# Patient Record
Sex: Female | Born: 1955 | Race: White | Hispanic: No | Marital: Married | Smoking: Former smoker
Health system: Southern US, Community
[De-identification: ages and names within clinical notes are randomized; demographics above are authoritative.]

## PROBLEM LIST (undated history)

## (undated) DIAGNOSIS — Z9889 Other specified postprocedural states: Secondary | ICD-10-CM

## (undated) DIAGNOSIS — R112 Nausea with vomiting, unspecified: Secondary | ICD-10-CM

## (undated) DIAGNOSIS — I10 Essential (primary) hypertension: Secondary | ICD-10-CM

## (undated) DIAGNOSIS — Z6824 Body mass index (BMI) 24.0-24.9, adult: Secondary | ICD-10-CM

## (undated) DIAGNOSIS — Z90710 Acquired absence of both cervix and uterus: Secondary | ICD-10-CM

## (undated) DIAGNOSIS — D069 Carcinoma in situ of cervix, unspecified: Secondary | ICD-10-CM

## (undated) DIAGNOSIS — B019 Varicella without complication: Secondary | ICD-10-CM

## (undated) DIAGNOSIS — H9313 Tinnitus, bilateral: Secondary | ICD-10-CM

## (undated) DIAGNOSIS — E559 Vitamin D deficiency, unspecified: Secondary | ICD-10-CM

## (undated) DIAGNOSIS — M1990 Unspecified osteoarthritis, unspecified site: Secondary | ICD-10-CM

## (undated) DIAGNOSIS — U071 COVID-19: Secondary | ICD-10-CM

## (undated) DIAGNOSIS — D260 Other benign neoplasm of cervix uteri: Secondary | ICD-10-CM

## (undated) DIAGNOSIS — M8580 Other specified disorders of bone density and structure, unspecified site: Secondary | ICD-10-CM

## (undated) DIAGNOSIS — R000 Tachycardia, unspecified: Secondary | ICD-10-CM

## (undated) HISTORY — DX: Vitamin D deficiency, unspecified: E55.9

## (undated) HISTORY — DX: Tachycardia, unspecified: R00.0

## (undated) HISTORY — DX: Unspecified osteoarthritis, unspecified site: M19.90

## (undated) HISTORY — DX: Other specified disorders of bone density and structure, unspecified site: M85.80

## (undated) HISTORY — DX: Varicella without complication: B01.9

## (undated) HISTORY — DX: Other benign neoplasm of cervix uteri: D26.0

## (undated) HISTORY — PX: TOTAL HIP ARTHROPLASTY: SHX124

## (undated) HISTORY — DX: Tinnitus, bilateral: H93.13

## (undated) HISTORY — DX: COVID-19: U07.1

## (undated) HISTORY — DX: Essential (primary) hypertension: I10

## (undated) HISTORY — DX: Body mass index (BMI) 24.0-24.9, adult: Z68.24

## (undated) HISTORY — DX: Acquired absence of both cervix and uterus: Z90.710

## (undated) HISTORY — DX: Carcinoma in situ of cervix, unspecified: D06.9

## (undated) HISTORY — PX: COLONOSCOPY: SHX174

---

## 1981-07-08 DIAGNOSIS — D069 Carcinoma in situ of cervix, unspecified: Secondary | ICD-10-CM

## 1981-07-08 HISTORY — DX: Carcinoma in situ of cervix, unspecified: D06.9

## 1981-07-08 HISTORY — PX: CERVICAL CONE BIOPSY: SUR198

## 1987-07-09 HISTORY — PX: VAGINAL HYSTERECTOMY: SUR661

## 2007-04-09 ENCOUNTER — Encounter: Admission: RE | Admit: 2007-04-09 | Discharge: 2007-04-09 | Payer: Self-pay | Admitting: Gynecology

## 2007-04-13 ENCOUNTER — Encounter: Admission: RE | Admit: 2007-04-13 | Discharge: 2007-04-13 | Payer: Self-pay | Admitting: Gynecology

## 2009-05-10 ENCOUNTER — Encounter: Admission: RE | Admit: 2009-05-10 | Discharge: 2009-05-10 | Payer: Self-pay | Admitting: Gynecology

## 2011-01-10 ENCOUNTER — Other Ambulatory Visit: Payer: Self-pay | Admitting: Gynecology

## 2011-01-10 DIAGNOSIS — Z1231 Encounter for screening mammogram for malignant neoplasm of breast: Secondary | ICD-10-CM

## 2011-01-24 ENCOUNTER — Ambulatory Visit
Admission: RE | Admit: 2011-01-24 | Discharge: 2011-01-24 | Disposition: A | Discharge status: Home / Self Care | Payer: BC Managed Care – PPO | Source: Ambulatory Visit | Attending: Gynecology | Admitting: Gynecology

## 2011-01-24 DIAGNOSIS — Z1231 Encounter for screening mammogram for malignant neoplasm of breast: Secondary | ICD-10-CM

## 2012-11-13 ENCOUNTER — Encounter: Payer: Self-pay | Admitting: *Deleted

## 2012-11-13 DIAGNOSIS — I10 Essential (primary) hypertension: Secondary | ICD-10-CM

## 2012-11-16 ENCOUNTER — Encounter: Payer: Self-pay | Admitting: *Deleted

## 2012-11-16 ENCOUNTER — Ambulatory Visit (INDEPENDENT_AMBULATORY_CARE_PROVIDER_SITE_OTHER): Payer: BC Managed Care – PPO | Admitting: Cardiovascular Disease

## 2012-11-16 ENCOUNTER — Encounter: Payer: Self-pay | Admitting: Cardiovascular Disease

## 2012-11-16 VITALS — BP 157/86 | HR 101 | Ht 64.0 in | Wt 127.8 lb

## 2012-11-16 DIAGNOSIS — I10 Essential (primary) hypertension: Secondary | ICD-10-CM

## 2012-11-16 DIAGNOSIS — R002 Palpitations: Secondary | ICD-10-CM | POA: Insufficient documentation

## 2012-11-16 DIAGNOSIS — F411 Generalized anxiety disorder: Secondary | ICD-10-CM

## 2012-11-16 DIAGNOSIS — F419 Anxiety disorder, unspecified: Secondary | ICD-10-CM | POA: Insufficient documentation

## 2012-11-16 NOTE — Progress Notes (Signed)
Patient ID: Nancy Burton, female   DOB: [birth year], 57 y.o.   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 Tax season just ended. Her daughter is in [place] and just got engaged.  Son moving into house to pursue MBA at [employer].  She has always been tense and anxious.  Palpitations seem benign. She does aerobil exercise every morning with no syncope, chest pain or dyspnea. She feels flip flops sometimes real hard " and I think I'm going to die".  Symptoms worse last few weeks for no apparent reason but has had them in past. No recent Hct and TSH on file.    ROS: Denies fever, malais, weight loss, blurry vision, decreased visual acuity, cough, sputum, SOB, hemoptysis, pleuritic pain, palpitaitons, heartburn, abdominal pain, melena, lower extremity edema, claudication, or rash.  All other systems reviewed and negative   General: Affect appropriate Healthy:  appears stated age HEENT: normal Neck supple with no adenopathy JVP normal no bruits no thyromegaly Lungs clear with no wheezing and good diaphragmatic motion Heart:  S1/S2 no murmur,rub, gallop or click PMI normal Abdomen: benighn, BS positve, no tenderness, no AAA no bruit.  No HSM or HJR Distal pulses intact with no bruits No edema Neuro non-focal Skin warm and dry No muscular weakness  Medications Current Outpatient Prescriptions  Medication Sig Dispense Refill  . aspirin 81 MG tablet Take 81 mg by mouth daily.      . Cholecalciferol (VITAMIN D-3) 1000 UNITS CAPS Take 1 capsule by mouth daily.      . fish oil-omega-3 fatty acids 1000 MG capsule Take 2 g by mouth daily.      Marland Kitchen lisinopril (PRINIVIL,ZESTRIL) 10 MG tablet       . Multiple Vitamin (MULTIVITAMIN) capsule Take 1 capsule by mouth daily.      Marland Kitchen PREVIDENT 5000 BOOSTER PLUS 1.1 % PSTE        No current facility-administered medications for this visit.     Allergies Review of patient's allergies indicates no known allergies.  Family History: Family History  Problem Relation Age of Onset  . Hyperlipidemia Father     Social History: History   Social History  . Marital Status: Married    Spouse Name: N/A    Number of Children: N/A  . Years of Education: N/A   Occupational History  . Not on file.   Social History Main Topics  . Smoking status: Former Games developer  . Smokeless tobacco: Not on file     Comment: quit 30 years ago  . Alcohol Use: No  . Drug Use: No  . Sexually Active: Not on file   Other Topics Concern  . Not on file   Social History Narrative  . No narrative on file    Electrocardiogram:  SR rate 95 normal ECG   Assessment and Plan

## 2012-11-16 NOTE — Assessment & Plan Note (Signed)
Benign sounding F/U stress echo to r/o structural heart disease

## 2012-11-16 NOTE — Assessment & Plan Note (Signed)
White coart overlay BP readings at home 90-100 systolic.  Continue low dose ACE

## 2012-11-16 NOTE — Addendum Note (Signed)
Addended by: Sherri Rad C on: 11/16/2012 02:39 PM   Modules accepted: Orders

## 2012-11-16 NOTE — Assessment & Plan Note (Signed)
Should consider cymbalta or other Rx.  She will likely feel better when daughter moves back from [place]

## 2012-11-16 NOTE — Patient Instructions (Addendum)
Your physician has requested that you have a stress echocardiogram. For further information please visit [email or web address] Please follow instruction sheet as given.  Your physician recommends that you have lab work today: hemoglobin/hematocrit/tsh/t4   Dr. Eden Emms will see you back on an as needed basis if your test are all normal.

## 2012-11-17 ENCOUNTER — Encounter: Payer: Self-pay | Admitting: *Deleted

## 2012-11-17 LAB — HEMATOCRIT: HCT: 37.2 % (ref 36.0–46.0)

## 2012-11-17 LAB — HEMOGLOBIN: Hemoglobin: 12.9 g/dL (ref 12.0–15.0)

## 2012-11-24 ENCOUNTER — Encounter (HOSPITAL_COMMUNITY): Payer: Self-pay

## 2012-11-24 ENCOUNTER — Ambulatory Visit (HOSPITAL_COMMUNITY)
Admission: RE | Admit: 2012-11-24 | Discharge: 2012-11-24 | Disposition: A | Discharge status: Home / Self Care | Payer: BC Managed Care – PPO | Source: Ambulatory Visit | Attending: Cardiovascular Disease | Admitting: Cardiovascular Disease

## 2012-11-24 DIAGNOSIS — I10 Essential (primary) hypertension: Secondary | ICD-10-CM | POA: Insufficient documentation

## 2012-11-24 DIAGNOSIS — F419 Anxiety disorder, unspecified: Secondary | ICD-10-CM

## 2012-11-24 DIAGNOSIS — R002 Palpitations: Secondary | ICD-10-CM

## 2012-11-24 NOTE — Progress Notes (Signed)
*  PRELIMINARY RESULTS* Echocardiogram Echocardiogram Stress Test has been performed.  Conrad Mineral 11/24/2012, 10:27 AM

## 2012-11-24 NOTE — Progress Notes (Signed)
Stress Lab Nurses Notes - [facility]  Sarahi Borland 11/24/2012 Reason for doing test: Palpitation & Anxiety Type of test: Stress Echo Nurse performing test: Parke Poisson, RN Nuclear Medicine Tech: Not Applicable Echo Tech: Karrie Doffing MD performing test: R. Rothbart Family MD: [facility] Test explained and consent signed: yes IV started: No IV started Symptoms: Fatigue & Mild SOB Treatment/Intervention: None Reason test stopped: reached target HR After recovery IV was: NA Patient to return to Nuc. Med at : NA Patient discharged: Home Patient's Condition upon discharge was: stable Comments: During test peak BP 182/78 & HR 184.   Recovery BP 141/74 & HR 117 .   Symptoms resolved in recovery. Erskine Speed T

## 2013-01-04 ENCOUNTER — Other Ambulatory Visit: Payer: Self-pay

## 2013-04-13 ENCOUNTER — Other Ambulatory Visit: Payer: Self-pay

## 2013-04-13 DIAGNOSIS — Z1231 Encounter for screening mammogram for malignant neoplasm of breast: Secondary | ICD-10-CM

## 2013-05-19 ENCOUNTER — Ambulatory Visit
Admission: RE | Admit: 2013-05-19 | Discharge: 2013-05-19 | Disposition: A | Discharge status: Home / Self Care | Payer: BC Managed Care – PPO | Source: Ambulatory Visit

## 2013-05-19 ENCOUNTER — Other Ambulatory Visit (HOSPITAL_COMMUNITY)
Admission: RE | Admit: 2013-05-19 | Discharge: 2013-05-19 | Disposition: A | Discharge status: Home / Self Care | Payer: BC Managed Care – PPO | Source: Ambulatory Visit | Attending: Gynecology | Admitting: Gynecology

## 2013-05-19 ENCOUNTER — Encounter: Payer: Self-pay | Admitting: Gynecology

## 2013-05-19 ENCOUNTER — Ambulatory Visit (INDEPENDENT_AMBULATORY_CARE_PROVIDER_SITE_OTHER): Payer: BC Managed Care – PPO | Admitting: Gynecology

## 2013-05-19 VITALS — BP 122/78 | Ht 64.0 in | Wt 135.0 lb

## 2013-05-19 DIAGNOSIS — N952 Postmenopausal atrophic vaginitis: Secondary | ICD-10-CM

## 2013-05-19 DIAGNOSIS — Z01419 Encounter for gynecological examination (general) (routine) without abnormal findings: Secondary | ICD-10-CM | POA: Insufficient documentation

## 2013-05-19 DIAGNOSIS — Z1231 Encounter for screening mammogram for malignant neoplasm of breast: Secondary | ICD-10-CM

## 2013-05-19 DIAGNOSIS — IMO0002 Reserved for concepts with insufficient information to code with codable children: Secondary | ICD-10-CM

## 2013-05-19 DIAGNOSIS — M8580 Other specified disorders of bone density and structure, unspecified site: Secondary | ICD-10-CM

## 2013-05-19 DIAGNOSIS — M899 Disorder of bone, unspecified: Secondary | ICD-10-CM

## 2013-05-19 NOTE — Patient Instructions (Signed)
Followup for bone density as scheduled. Otherwise followup in one year for annual exam. 

## 2013-05-19 NOTE — Progress Notes (Addendum)
Nancy Burton April 22, 1956 [id number]        57 y.o.  [id number] for annual exam.  Former patient of Dr. Nicholas Lose doing well without complaints.  Past medical history,surgical history, problem list, medications, allergies, family history and social history were all reviewed and documented in the EPIC chart.  ROS:  Performed and pertinent positives and negatives are included in the history, assessment and plan .  Exam: Kim assistant Filed Vitals:   05/19/13 1454  BP: 122/78  Height: 5\' 4"  (1.626 m)  Weight: 135 lb (61.236 kg)   General appearance  Normal Skin grossly normal Head/Neck normal with no cervical or supraclavicular adenopathy thyroid normal Lungs  clear Cardiac RR, without RMG Abdominal  soft, nontender, without masses, organomegaly or hernia Breasts  examined lying and sitting without masses, retractions, discharge or axillary adenopathy. Pelvic  Ext/BUS/vagina  with atrophic changes, PAP of cuff done   Adnexa  Without masses or tenderness    Anus and perineum  normal   Rectovaginal  normal sphincter tone without palpated masses or tenderness.    Assessment/Plan:  57 y.o. [id number] female for annual exam.   1. History of vaginal hysterectomy in followup of cone biopsy for persistent cervical dysplasia historically. Patient reports her Pap smears have all been normal since then. I do not have copies of these records but recommended we continue with vaginal cuff surveillance and Pap smear was done today. Will attempt to get records from the hysterectomy. 2. Atrophic vaginal changes. Patient is having some dyspareunia with intercourse. Not having vaginal dryness in between. No significant hot flushes or night sweats. Options for management were reviewed to include OTC products versus vaginal estrogen such as estrogen cream, Vagifem, Osphena and full HRT. Patient is not interested in estrogen products. She prefers OTC products and will followup if this continues to be an  issue. 3. Pap smear 2012. Pap smear done today per #1. Assuming negative will pursue less frequent screening intervals every 3 years but will continue to screen status post hysterectomy given cervical dysplasia as indication for hysterectomy. 4. Mammography scheduled today. Will continue with annual mammography. SBE monthly reviewed. 5. DEXA reported 2012. Was told that she had some weakness. Repeat DEXA now. Increase calcium vitamin D reviewed. Will obtain copy of her prior DEXA possible. 6. Colonoscopy 2013. Repeat that their recommended interval. 7. Health maintenance. No blood work done as this is reportedly done through her primary physician's office. Followup one year, sooner as needed.   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  Note: This document was prepared with digital dictation and possible smart phrase technology. Any transcriptional errors that result from this process are unintentional.   Dara Lords MD, 4:13 PM 05/19/2013

## 2013-05-20 LAB — URINALYSIS W MICROSCOPIC + REFLEX CULTURE
Casts: NONE SEEN
Crystals: NONE SEEN
Glucose, UA: NEGATIVE mg/dL
Leukocytes, UA: NEGATIVE
Nitrite: NEGATIVE
Specific Gravity, Urine: 1.006 (ref 1.005–1.030)
Squamous Epithelial / LPF: NONE SEEN

## 2013-05-26 ENCOUNTER — Encounter: Payer: Self-pay | Admitting: Gynecology

## 2013-06-21 ENCOUNTER — Ambulatory Visit (INDEPENDENT_AMBULATORY_CARE_PROVIDER_SITE_OTHER): Payer: BC Managed Care – PPO

## 2013-06-21 DIAGNOSIS — M8580 Other specified disorders of bone density and structure, unspecified site: Secondary | ICD-10-CM

## 2013-06-21 DIAGNOSIS — M899 Disorder of bone, unspecified: Secondary | ICD-10-CM

## 2013-06-22 ENCOUNTER — Encounter: Payer: Self-pay | Admitting: Gynecology

## 2014-05-09 ENCOUNTER — Encounter: Payer: Self-pay | Admitting: Gynecology

## 2014-07-16 ENCOUNTER — Ambulatory Visit: Payer: Self-pay | Admitting: Orthopedic Surgery

## 2014-07-16 NOTE — Pre-Procedure Instructions (Signed)
Nancy Burton  07/16/2014   Your procedure is scheduled on:  January 22  Report to [facility] Admitting at 07:30 AM.  Call this number if you have problems the morning of surgery: [phone]   Remember:   Do not eat food or drink liquids after midnight.   Take these medicines the morning of surgery with A SIP OF WATER: None   STOP Calcium, Fish Oil, Multiple Vitamin January 15   STOP/ Do not take Aspirin, Aleve, Naproxen, Advil, Ibuprofen, Motrin, Vitamins, Herbs, or Supplements starting January 15   Do not wear jewelry, make-up or nail polish.  Do not wear lotions, powders, or perfumes. You may wear deodorant.  Do not shave 48 hours prior to surgery. Men may shave face and neck.  Do not bring valuables to the hospital.  [facility] is not responsible for any belongings or valuables.               Contacts, dentures or bridgework may not be worn into surgery.  Leave suitcase in the car. After surgery it may be brought to your room.  For patients admitted to the hospital, discharge time is determined by your treatment team.               Special Instructions: [facility] - Preparing for Surgery  Before surgery, you can play an important role.  Because skin is not sterile, your skin needs to be as free of germs as possible.  You can reduce the number of germs on you skin by washing with CHG (chlorahexidine gluconate) soap before surgery.  CHG is an antiseptic cleaner which kills germs and bonds with the skin to continue killing germs even after washing.  Please DO NOT use if you have an allergy to CHG or antibacterial soaps.  If your skin becomes reddened/irritated stop using the CHG and inform your nurse when you arrive at Short Stay.  Do not shave (including legs and underarms) for at least 48 hours prior to the first CHG shower.  You may shave your face.  Please follow these instructions carefully:   1.  Shower with CHG Soap the night before surgery and the  morning of Surgery.  2.  If you choose to wash your hair, wash your hair first as usual with your normal shampoo.  3.  After you shampoo, rinse your hair and body thoroughly to remove the shampoo.  4.  Use CHG as you would any other liquid soap.  You can apply CHG directly to the skin and wash gently with scrungie or a clean washcloth.  5.  Apply the CHG Soap to your body ONLY FROM THE NECK DOWN.  Do not use on open wounds or open sores.  Avoid contact with your eyes, ears, mouth and genitals (private parts).  Wash genitals (private parts) with your normal soap.  6.  Wash thoroughly, paying special attention to the area where your surgery will be performed.  7.  Thoroughly rinse your body with warm water from the neck down.  8.  DO NOT shower/wash with your normal soap after using and rinsing off the CHG Soap.  9.  Pat yourself dry with a clean towel.            10.  Wear clean pajamas.            11.  Place clean sheets on your bed the night of your first shower and do not sleep with pets.  Day  of Surgery  Do not apply any lotions the morning of surgery.  Please wear clean clothes to the hospital/surgery center.     Please read over the following fact sheets that you were given: Pain Booklet, Coughing and Deep Breathing, Blood Transfusion Information and Surgical Site Infection Prevention

## 2014-07-16 NOTE — Progress Notes (Signed)
Preoperative surgical orders have been place into the Epic hospital system for Nancy Burton on 07/16/2014, 9:29 AM  by Mickel Crow for surgery on 07/29/2014.  Preop Total Hip - Anterior Approach orders including Experel Injecion, IV Tylenol, and IV Decadron as long as there are no contraindications to the above medications. [name], PA-C

## 2014-07-18 ENCOUNTER — Encounter (HOSPITAL_COMMUNITY): Payer: Self-pay

## 2014-07-18 ENCOUNTER — Encounter (HOSPITAL_COMMUNITY)
Admission: RE | Admit: 2014-07-18 | Discharge: 2014-07-18 | Disposition: A | Discharge status: Home / Self Care | Payer: BLUE CROSS/BLUE SHIELD | Source: Ambulatory Visit | Attending: Orthopedic Surgery | Admitting: Orthopedic Surgery

## 2014-07-18 DIAGNOSIS — Z01812 Encounter for preprocedural laboratory examination: Secondary | ICD-10-CM | POA: Insufficient documentation

## 2014-07-18 DIAGNOSIS — Z01810 Encounter for preprocedural cardiovascular examination: Secondary | ICD-10-CM | POA: Insufficient documentation

## 2014-07-18 DIAGNOSIS — Z01818 Encounter for other preprocedural examination: Secondary | ICD-10-CM | POA: Insufficient documentation

## 2014-07-18 DIAGNOSIS — M1612 Unilateral primary osteoarthritis, left hip: Secondary | ICD-10-CM | POA: Insufficient documentation

## 2014-07-18 HISTORY — DX: Unspecified osteoarthritis, unspecified site: M19.90

## 2014-07-18 HISTORY — DX: Nausea with vomiting, unspecified: R11.2

## 2014-07-18 HISTORY — DX: Other specified postprocedural states: Z98.890

## 2014-07-18 LAB — URINALYSIS, ROUTINE W REFLEX MICROSCOPIC
Bilirubin Urine: NEGATIVE
Glucose, UA: NEGATIVE mg/dL
HGB URINE DIPSTICK: NEGATIVE
Ketones, ur: NEGATIVE mg/dL
Leukocytes, UA: NEGATIVE
Nitrite: NEGATIVE
Protein, ur: NEGATIVE mg/dL
Specific Gravity, Urine: 1.011 (ref 1.005–1.030)
Urobilinogen, UA: 0.2 mg/dL (ref 0.0–1.0)
pH: 5 (ref 5.0–8.0)

## 2014-07-18 LAB — COMPREHENSIVE METABOLIC PANEL
ALT: 41 U/L — ABNORMAL HIGH (ref 0–35)
AST: 34 U/L (ref 0–37)
Albumin: 4.5 g/dL (ref 3.5–5.2)
Alkaline Phosphatase: 92 U/L (ref 39–117)
Anion gap: 5 (ref 5–15)
BUN: 23 mg/dL (ref 6–23)
CO2: 28 mmol/L (ref 19–32)
Calcium: 9.1 mg/dL (ref 8.4–10.5)
Chloride: 104 mEq/L (ref 96–112)
Creatinine, Ser: 0.84 mg/dL (ref 0.50–1.10)
GFR calc Af Amer: 86 mL/min — ABNORMAL LOW (ref >90)
GFR calc non Af Amer: 75 mL/min — ABNORMAL LOW (ref >90)
Glucose, Bld: 102 mg/dL — ABNORMAL HIGH (ref 70–99)
POTASSIUM: 4 mmol/L (ref 3.5–5.1)
SODIUM: 137 mmol/L (ref 135–145)
Total Bilirubin: 0.8 mg/dL (ref 0.3–1.2)
Total Protein: 7.3 g/dL (ref 6.0–8.3)

## 2014-07-18 LAB — ABO/RH: ABO/RH(D): O POS

## 2014-07-18 LAB — TYPE AND SCREEN
ABO/RH(D): O POS
ANTIBODY SCREEN: NEGATIVE

## 2014-07-18 LAB — CBC
HCT: 38.8 % (ref 36.0–46.0)
Hemoglobin: 12.7 g/dL (ref 12.0–15.0)
MCH: 30.2 pg (ref 26.0–34.0)
MCHC: 32.7 g/dL (ref 30.0–36.0)
MCV: 92.4 fL (ref 78.0–100.0)
Platelets: 337 10*3/uL (ref 150–400)
RBC: 4.2 MIL/uL (ref 3.87–5.11)
RDW: 12.6 % (ref 11.5–15.5)
WBC: 5.8 10*3/uL (ref 4.0–10.5)

## 2014-07-18 LAB — SURGICAL PCR SCREEN
MRSA, PCR: NEGATIVE
Staphylococcus aureus: NEGATIVE

## 2014-07-18 LAB — APTT: aPTT: 34 seconds (ref 24–37)

## 2014-07-18 LAB — PROTIME-INR
INR: 0.96 (ref 0.00–1.49)
PROTHROMBIN TIME: 12.9 s (ref 11.6–15.2)

## 2014-07-18 NOTE — Progress Notes (Signed)
Primary - dr. claget ([facility]) No cardiologist Stress 2014, none since

## 2014-07-26 NOTE — H&P (Signed)
TOTAL HIP ADMISSION H&P  Patient is admitted for left total hip arthroplasty.  Subjective:  Chief Complaint: left hip pain  HPI: Nancy Burton, 59 y.o. female, has a history of pain and functional disability in the left hip(s) due to arthritis and patient has failed non-surgical conservative treatments for greater than 12 weeks to include NSAID's and/or analgesics, weight reduction as appropriate and activity modification.  Onset of symptoms was gradual starting 2 years ago with gradually worsening course since that time.The patient noted no past surgery on the left hip(s).  Patient currently rates pain in the left hip at 7 out of 10 with activity. Patient has night pain, worsening of pain with activity and weight bearing, pain that interfers with activities of daily living and pain with passive range of motion. Patient has evidence of periarticular osteophytes, joint subluxation and joint space narrowing by imaging studies.  There is no current active infection.  Patient Active Problem List   Diagnosis Date Noted  . Palpitations 11/16/2012  . Anxiety 11/16/2012  . HTN (hypertension) 11/13/2012   Past Medical History  Diagnosis Date  . Hypertension   . Adenocarcinoma in situ (AIS) of uterine cervix 1983    Cone biopsy the cervix subsequent, TVH negative  . Osteopenia 06/2013    T score -1.4 FRAX 6.6%/0.4%  . PONV (postoperative nausea and vomiting)   . Arthritis   . Cancer     cervical    Past Surgical History  Procedure Laterality Date  . Vaginal hysterectomy  1989    History of  adenocarcinoma in situ of the endocervix  . Cervical cone biopsy  1983  . Colonoscopy        Current outpatient prescriptions:  .  Calcium Carbonate-Vitamin D (CALCIUM + D PO), Take 2 tablets by mouth daily. , Disp: , Rfl:  .  fish oil-omega-3 fatty acids 1000 MG capsule, Take 2 g by mouth daily., Disp: , Rfl:  .  lisinopril (PRINIVIL,ZESTRIL) 10 MG tablet, Take 10 mg by mouth daily. , Disp: ,  Rfl:  .  Multiple Vitamin (MULTIVITAMIN) capsule, Take 1 capsule by mouth daily., Disp: , Rfl:  .  acetaminophen (TYLENOL) 500 MG tablet, Take 1,000 mg by mouth every 6 (six) hours as needed., Disp: , Rfl:  .  naproxen sodium (ANAPROX) 220 MG tablet, Take 220 mg by mouth 2 (two) times daily with a meal., Disp: , Rfl:   No Known Allergies  History  Substance Use Topics  . Smoking status: Former Research scientist (life sciences)  . Smokeless tobacco: Not on file  . Alcohol Use: Yes     Comment: Occas wine    Family History  Problem Relation Age of Onset  . Hyperlipidemia Father   . Uterine cancer Mother   . Diabetes Other      Review of Systems  Constitutional: Negative.   HENT: Positive for tinnitus. Negative for congestion, ear discharge, ear pain, hearing loss, nosebleeds and sore throat.   Eyes: Negative.   Respiratory: Negative.  Negative for stridor.   Cardiovascular: Positive for palpitations. Negative for chest pain, orthopnea, claudication, leg swelling and PND.  Gastrointestinal: Negative.   Genitourinary: Negative.  Negative for dysuria, urgency, frequency, hematuria and flank pain.       Positive for incontinence  Musculoskeletal: Positive for joint pain. Negative for myalgias, back pain, falls and neck pain.       Left hip pain  Skin: Negative.   Neurological: Negative.  Negative for headaches.  Endo/Heme/Allergies: Negative.  Psychiatric/Behavioral: Negative.     Objective:  Physical Exam  Constitutional: She is oriented to person, place, and time. She appears well-developed and well-nourished. No distress.  HENT:  Head: Normocephalic and atraumatic.  Right Ear: External ear normal.  Left Ear: External ear normal.  Nose: Nose normal.  Mouth/Throat: Oropharynx is clear and moist.  Eyes: Conjunctivae and EOM are normal.  Neck: Normal range of motion. Neck supple.  Cardiovascular: Normal rate, regular rhythm, normal heart sounds and intact distal pulses.   No murmur  heard. Respiratory: Effort normal and breath sounds normal. No respiratory distress. She has no wheezes.  GI: Soft. Bowel sounds are normal. She exhibits no distension. There is no tenderness.  Musculoskeletal:  Evaluation of her left hip, flexion to 90. No internal rotation, about 10 degrees of external rotation, 20 degrees of abduction. She has a normal knee exam.  Neurological: She is alert and oriented to person, place, and time. She has normal strength and normal reflexes. No sensory deficit.  Skin: No rash noted. She is not diaphoretic. No erythema.  Psychiatric: She has a normal mood and affect. Her behavior is normal.    Vitals  Weight: 135.31 lb Height: 64in Body Surface Area: 1.66 m Body Mass Index: 23.23 kg/m  Pulse: 88 (Regular)  BP: 160/89 (Sitting, Left Arm, Standard)  Imaging Review Plain radiographs demonstrate severe degenerative joint disease of the left hip(s). The bone quality appears to be good for age and reported activity level.  Assessment/Plan:  End stage arthritis, left hip(s)  The patient history, physical examination, clinical judgement of the provider and imaging studies are consistent with end stage degenerative joint disease of the left hip(s) and total hip arthroplasty is deemed medically necessary. The treatment options including medical management, injection therapy, arthroscopy and arthroplasty were discussed at length. The risks and benefits of total hip arthroplasty were presented and reviewed. The risks due to aseptic loosening, infection, stiffness, dislocation/subluxation,  thromboembolic complications and other imponderables were discussed.  The patient acknowledged the explanation, agreed to proceed with the plan and consent was signed. Patient is being admitted for inpatient treatment for surgery, pain control, PT, OT, prophylactic antibiotics, VTE prophylaxis, progressive ambulation and ADL's and discharge planning.The patient is planning  to be discharged home with home health services    PCP: [place], [place]

## 2014-07-28 MED ORDER — SODIUM CHLORIDE 0.9 % IV SOLN: INTRAVENOUS | Status: DC

## 2014-07-28 MED ORDER — DEXAMETHASONE SODIUM PHOSPHATE 10 MG/ML IJ SOLN: 10.0000 mg | Freq: Once | INTRAMUSCULAR | Status: DC

## 2014-07-28 MED ORDER — ACETAMINOPHEN 10 MG/ML IV SOLN
1000.0000 mg | Freq: Once | INTRAVENOUS | Status: AC
  Administered 2014-07-29: 1000 mg via INTRAVENOUS
  Filled 2014-07-28: qty 100

## 2014-07-28 MED ORDER — BUPIVACAINE LIPOSOME 1.3 % IJ SUSP
20.0000 mL | Freq: Once | INTRAMUSCULAR | Status: DC
Start: 2014-07-28 — End: 2014-07-29
  Filled 2014-07-28: qty 20

## 2014-07-28 MED ORDER — CEFAZOLIN SODIUM-DEXTROSE 2-3 GM-% IV SOLR
2.0000 g | INTRAVENOUS | Status: AC
  Administered 2014-07-29: 2 g via INTRAVENOUS
  Filled 2014-07-28: qty 50

## 2014-07-28 NOTE — Anesthesia Preprocedure Evaluation (Addendum)
Anesthesia Evaluation  Patient identified by MRN, date of birth, ID band Patient awake    Reviewed: Allergy & Precautions, NPO status , Patient's Chart, lab work & pertinent test results, reviewed documented beta blocker date and time   History of Anesthesia Complications (+) PONV  Airway Mallampati: II   Neck ROM: Full    Dental  (+) Teeth Intact, Dental Advisory Given   Pulmonary former smoker,  breath sounds clear to auscultation        Cardiovascular hypertension, Pt. on medications Rhythm:Regular  ECHO 5/ 2014 EF 70%, EKG 07/2014 wnl   Neuro/Psych Anxiety    GI/Hepatic negative GI ROS, Neg liver ROS,   Endo/Other  negative endocrine ROS  Renal/GU negative Renal ROS     Musculoskeletal   Abdominal (+)  Abdomen: soft.    Peds  Hematology negative hematology ROS (+)   Anesthesia Other Findings   Reproductive/Obstetrics                            Anesthesia Physical Anesthesia Plan  ASA: II  Anesthesia Plan: General   Post-op Pain Management:    Induction: Intravenous  Airway Management Planned: Oral ETT  Additional Equipment:   Intra-op Plan:   Post-operative Plan: Extubation in OR  Informed Consent: I have reviewed the patients History and Physical, chart, labs and discussed the procedure including the risks, benefits and alternatives for the proposed anesthesia with the patient or authorized representative who has indicated his/her understanding and acceptance.     Plan Discussed with:   Anesthesia Plan Comments:         Anesthesia Quick Evaluation

## 2014-07-29 ENCOUNTER — Inpatient Hospital Stay (HOSPITAL_COMMUNITY)
Admission: RE | Admit: 2014-07-29 | Discharge: 2014-07-31 | DRG: 470 | Disposition: A | Discharge status: Home Health | Payer: BLUE CROSS/BLUE SHIELD | Source: Ambulatory Visit | Attending: Orthopedic Surgery | Admitting: Orthopedic Surgery

## 2014-07-29 ENCOUNTER — Encounter (HOSPITAL_COMMUNITY): Payer: Self-pay | Admitting: *Deleted

## 2014-07-29 ENCOUNTER — Encounter (HOSPITAL_COMMUNITY)
Admission: RE | Disposition: A | Discharge status: Home Health | Payer: Self-pay | Source: Ambulatory Visit | Attending: Orthopedic Surgery

## 2014-07-29 ENCOUNTER — Inpatient Hospital Stay (HOSPITAL_COMMUNITY): Payer: BLUE CROSS/BLUE SHIELD

## 2014-07-29 ENCOUNTER — Inpatient Hospital Stay (HOSPITAL_COMMUNITY): Payer: BLUE CROSS/BLUE SHIELD | Admitting: Anesthesiology

## 2014-07-29 DIAGNOSIS — Z833 Family history of diabetes mellitus: Secondary | ICD-10-CM

## 2014-07-29 DIAGNOSIS — F419 Anxiety disorder, unspecified: Secondary | ICD-10-CM | POA: Diagnosis present

## 2014-07-29 DIAGNOSIS — Z8049 Family history of malignant neoplasm of other genital organs: Secondary | ICD-10-CM | POA: Diagnosis not present

## 2014-07-29 DIAGNOSIS — Z8541 Personal history of malignant neoplasm of cervix uteri: Secondary | ICD-10-CM | POA: Diagnosis not present

## 2014-07-29 DIAGNOSIS — Z90710 Acquired absence of both cervix and uterus: Secondary | ICD-10-CM | POA: Diagnosis not present

## 2014-07-29 DIAGNOSIS — Z96649 Presence of unspecified artificial hip joint: Secondary | ICD-10-CM

## 2014-07-29 DIAGNOSIS — Z87891 Personal history of nicotine dependence: Secondary | ICD-10-CM

## 2014-07-29 DIAGNOSIS — M169 Osteoarthritis of hip, unspecified: Secondary | ICD-10-CM | POA: Diagnosis present

## 2014-07-29 DIAGNOSIS — M1612 Unilateral primary osteoarthritis, left hip: Secondary | ICD-10-CM | POA: Diagnosis present

## 2014-07-29 DIAGNOSIS — M25552 Pain in left hip: Secondary | ICD-10-CM | POA: Diagnosis present

## 2014-07-29 DIAGNOSIS — I10 Essential (primary) hypertension: Secondary | ICD-10-CM | POA: Diagnosis present

## 2014-07-29 HISTORY — PX: TOTAL HIP ARTHROPLASTY: SHX124

## 2014-07-29 SURGERY — ARTHROPLASTY, HIP, TOTAL, ANTERIOR APPROACH
Anesthesia: General | Site: Hip | Laterality: Left

## 2014-07-29 MED ORDER — RIVAROXABAN 10 MG PO TABS
10.0000 mg | ORAL_TABLET | Freq: Every day | ORAL | Status: DC
  Administered 2014-07-30 – 2014-07-31 (×2): 10 mg via ORAL
  Filled 2014-07-29 (×3): qty 1

## 2014-07-29 MED ORDER — DEXTROSE 5 % IV SOLN
500.0000 mg | Freq: Four times a day (QID) | INTRAVENOUS | Status: DC | PRN
  Filled 2014-07-29: qty 5

## 2014-07-29 MED ORDER — MIDAZOLAM HCL 5 MG/5ML IJ SOLN: INTRAMUSCULAR | Status: DC | PRN

## 2014-07-29 MED ORDER — PROMETHAZINE HCL 25 MG/ML IJ SOLN: 6.2500 mg | INTRAMUSCULAR | Status: DC | PRN

## 2014-07-29 MED ORDER — BUPIVACAINE LIPOSOME 1.3 % IJ SUSP
INTRAMUSCULAR | Status: DC | PRN
  Administered 2014-07-29: 20 mL

## 2014-07-29 MED ORDER — DEXAMETHASONE SODIUM PHOSPHATE 4 MG/ML IJ SOLN
INTRAMUSCULAR | Status: DC | PRN
  Administered 2014-07-29: 4 mg via INTRAVENOUS

## 2014-07-29 MED ORDER — ONDANSETRON HCL 4 MG/2ML IJ SOLN
INTRAMUSCULAR | Status: DC | PRN
  Administered 2014-07-29: 4 mg via INTRAVENOUS

## 2014-07-29 MED ORDER — MEPERIDINE HCL 25 MG/ML IJ SOLN: 6.2500 mg | INTRAMUSCULAR | Status: DC | PRN

## 2014-07-29 MED ORDER — CHLORHEXIDINE GLUCONATE 4 % EX LIQD
60.0000 mL | Freq: Once | CUTANEOUS | Status: DC
  Filled 2014-07-29: qty 60

## 2014-07-29 MED ORDER — ACETAMINOPHEN 325 MG PO TABS: 650.0000 mg | ORAL_TABLET | Freq: Four times a day (QID) | ORAL | Status: DC | PRN

## 2014-07-29 MED ORDER — FENTANYL CITRATE 0.05 MG/ML IJ SOLN
INTRAMUSCULAR | Status: AC
  Filled 2014-07-29: qty 5

## 2014-07-29 MED ORDER — LACTATED RINGERS IV SOLN
INTRAVENOUS | Status: DC | PRN
  Administered 2014-07-29 (×2): via INTRAVENOUS

## 2014-07-29 MED ORDER — LIDOCAINE HCL (CARDIAC) 20 MG/ML IV SOLN
INTRAVENOUS | Status: AC
  Filled 2014-07-29: qty 5

## 2014-07-29 MED ORDER — EPHEDRINE SULFATE 50 MG/ML IJ SOLN
INTRAMUSCULAR | Status: AC
  Filled 2014-07-29: qty 1

## 2014-07-29 MED ORDER — EPHEDRINE SULFATE 50 MG/ML IJ SOLN
INTRAMUSCULAR | Status: DC | PRN
  Administered 2014-07-29 (×4): 5 mg via INTRAVENOUS

## 2014-07-29 MED ORDER — ONDANSETRON HCL 4 MG/2ML IJ SOLN: 4.0000 mg | Freq: Four times a day (QID) | INTRAMUSCULAR | Status: DC | PRN

## 2014-07-29 MED ORDER — SODIUM CHLORIDE 0.9 % IJ SOLN
INTRAMUSCULAR | Status: DC | PRN
  Administered 2014-07-29: 30 mL

## 2014-07-29 MED ORDER — ACETAMINOPHEN 500 MG PO TABS
1000.0000 mg | ORAL_TABLET | Freq: Four times a day (QID) | ORAL | Status: AC
  Administered 2014-07-29 – 2014-07-30 (×4): 1000 mg via ORAL
  Filled 2014-07-29 (×4): qty 2

## 2014-07-29 MED ORDER — KETOROLAC TROMETHAMINE 15 MG/ML IJ SOLN
7.5000 mg | Freq: Four times a day (QID) | INTRAMUSCULAR | Status: AC | PRN
  Filled 2014-07-29: qty 1

## 2014-07-29 MED ORDER — FLEET ENEMA 7-19 GM/118ML RE ENEM: 1.0000 | ENEMA | Freq: Once | RECTAL | Status: AC | PRN

## 2014-07-29 MED ORDER — CEFAZOLIN SODIUM-DEXTROSE 2-3 GM-% IV SOLR
2.0000 g | Freq: Four times a day (QID) | INTRAVENOUS | Status: AC
  Administered 2014-07-29 (×2): 2 g via INTRAVENOUS
  Filled 2014-07-29 (×2): qty 50

## 2014-07-29 MED ORDER — TRAMADOL HCL 50 MG PO TABS
50.0000 mg | ORAL_TABLET | Freq: Four times a day (QID) | ORAL | Status: DC | PRN
  Administered 2014-07-30 – 2014-07-31 (×3): 100 mg via ORAL
  Filled 2014-07-29 (×3): qty 2

## 2014-07-29 MED ORDER — ONDANSETRON HCL 4 MG/2ML IJ SOLN
INTRAMUSCULAR | Status: AC
  Filled 2014-07-29: qty 2

## 2014-07-29 MED ORDER — PHENYLEPHRINE HCL 10 MG/ML IJ SOLN
INTRAMUSCULAR | Status: DC | PRN
Start: 2014-07-29 — End: 2014-07-29
  Administered 2014-07-29: 40 ug via INTRAVENOUS
  Administered 2014-07-29 (×2): 80 ug via INTRAVENOUS

## 2014-07-29 MED ORDER — MENTHOL 3 MG MT LOZG: 1.0000 | LOZENGE | OROMUCOSAL | Status: DC | PRN

## 2014-07-29 MED ORDER — PROPOFOL 10 MG/ML IV BOLUS
INTRAVENOUS | Status: DC | PRN
  Administered 2014-07-29 (×2): 20 mg via INTRAVENOUS
  Administered 2014-07-29: 160 mg via INTRAVENOUS

## 2014-07-29 MED ORDER — ACETAMINOPHEN 650 MG RE SUPP: 650.0000 mg | Freq: Four times a day (QID) | RECTAL | Status: DC | PRN

## 2014-07-29 MED ORDER — ONDANSETRON HCL 4 MG PO TABS
4.0000 mg | ORAL_TABLET | Freq: Four times a day (QID) | ORAL | Status: DC | PRN
  Filled 2014-07-29: qty 1

## 2014-07-29 MED ORDER — MIDAZOLAM HCL 2 MG/2ML IJ SOLN
INTRAMUSCULAR | Status: AC
  Filled 2014-07-29: qty 2

## 2014-07-29 MED ORDER — FENTANYL CITRATE 0.05 MG/ML IJ SOLN: INTRAMUSCULAR | Status: DC | PRN

## 2014-07-29 MED ORDER — ROCURONIUM BROMIDE 50 MG/5ML IV SOLN
INTRAVENOUS | Status: AC
  Filled 2014-07-29: qty 1

## 2014-07-29 MED ORDER — PROPOFOL 10 MG/ML IV BOLUS
INTRAVENOUS | Status: AC
  Filled 2014-07-29: qty 20

## 2014-07-29 MED ORDER — LACTATED RINGERS IV SOLN: INTRAVENOUS | Status: DC | PRN

## 2014-07-29 MED ORDER — BUPIVACAINE HCL (PF) 0.25 % IJ SOLN
INTRAMUSCULAR | Status: AC
  Filled 2014-07-29: qty 30

## 2014-07-29 MED ORDER — FENTANYL CITRATE 0.05 MG/ML IJ SOLN
INTRAMUSCULAR | Status: DC | PRN
  Administered 2014-07-29 (×3): 50 ug via INTRAVENOUS
  Administered 2014-07-29: 100 ug via INTRAVENOUS

## 2014-07-29 MED ORDER — BUPIVACAINE HCL (PF) 0.25 % IJ SOLN
INTRAMUSCULAR | Status: DC | PRN
  Administered 2014-07-29: 20 mL

## 2014-07-29 MED ORDER — METHOCARBAMOL 500 MG PO TABS
500.0000 mg | ORAL_TABLET | Freq: Four times a day (QID) | ORAL | Status: DC | PRN
Start: 2014-07-29 — End: 2014-07-31
  Administered 2014-07-29: 500 mg via ORAL
  Filled 2014-07-29 (×2): qty 1

## 2014-07-29 MED ORDER — PHENYLEPHRINE 40 MCG/ML (10ML) SYRINGE FOR IV PUSH (FOR BLOOD PRESSURE SUPPORT)
PREFILLED_SYRINGE | INTRAVENOUS | Status: AC
  Filled 2014-07-29: qty 20

## 2014-07-29 MED ORDER — MORPHINE SULFATE 2 MG/ML IJ SOLN: 1.0000 mg | INTRAMUSCULAR | Status: DC | PRN

## 2014-07-29 MED ORDER — DOCUSATE SODIUM 100 MG PO CAPS
100.0000 mg | ORAL_CAPSULE | Freq: Two times a day (BID) | ORAL | Status: DC
  Administered 2014-07-29 – 2014-07-31 (×5): 100 mg via ORAL
  Filled 2014-07-29 (×6): qty 1

## 2014-07-29 MED ORDER — SCOPOLAMINE 1 MG/3DAYS TD PT72
1.0000 | MEDICATED_PATCH | Freq: Once | TRANSDERMAL | Status: DC
  Administered 2014-07-29: 1.5 mg via TRANSDERMAL
  Filled 2014-07-29: qty 1

## 2014-07-29 MED ORDER — OXYCODONE HCL 5 MG PO TABS: 5.0000 mg | ORAL_TABLET | ORAL | Status: DC | PRN

## 2014-07-29 MED ORDER — POTASSIUM CHLORIDE IN NACL 20-0.9 MEQ/L-% IV SOLN
INTRAVENOUS | Status: DC
  Administered 2014-07-29: 14:00:00 via INTRAVENOUS
  Administered 2014-07-30: 1000 mL via INTRAVENOUS
  Filled 2014-07-29 (×6): qty 1000

## 2014-07-29 MED ORDER — TRANEXAMIC ACID 100 MG/ML IV SOLN
2000.0000 mg | INTRAVENOUS | Status: AC
  Administered 2014-07-29: 2000 mg via TOPICAL
  Filled 2014-07-29: qty 20

## 2014-07-29 MED ORDER — MIDAZOLAM HCL 5 MG/5ML IJ SOLN
INTRAMUSCULAR | Status: DC | PRN
  Administered 2014-07-29 (×2): 2 mg via INTRAVENOUS

## 2014-07-29 MED ORDER — POLYETHYLENE GLYCOL 3350 17 G PO PACK: 17.0000 g | PACK | Freq: Every day | ORAL | Status: DC | PRN

## 2014-07-29 MED ORDER — LACTATED RINGERS IV SOLN
INTRAVENOUS | Status: DC
  Administered 2014-07-29: 07:00:00 via INTRAVENOUS

## 2014-07-29 MED ORDER — ARTIFICIAL TEARS OP OINT
TOPICAL_OINTMENT | OPHTHALMIC | Status: AC
  Filled 2014-07-29: qty 3.5

## 2014-07-29 MED ORDER — DEXAMETHASONE SODIUM PHOSPHATE 10 MG/ML IJ SOLN
10.0000 mg | Freq: Once | INTRAMUSCULAR | Status: AC
  Administered 2014-07-30: 10 mg via INTRAVENOUS
  Filled 2014-07-29 (×2): qty 1

## 2014-07-29 MED ORDER — METOCLOPRAMIDE HCL 5 MG/ML IJ SOLN: 5.0000 mg | Freq: Three times a day (TID) | INTRAMUSCULAR | Status: DC | PRN

## 2014-07-29 MED ORDER — FENTANYL CITRATE 0.05 MG/ML IJ SOLN
25.0000 ug | INTRAMUSCULAR | Status: DC | PRN
Start: 2014-07-29 — End: 2014-07-29

## 2014-07-29 MED ORDER — LIDOCAINE HCL (CARDIAC) 20 MG/ML IV SOLN
INTRAVENOUS | Status: DC | PRN
  Administered 2014-07-29: 60 mg via INTRAVENOUS
  Administered 2014-07-29 (×2): 20 mg via INTRAVENOUS

## 2014-07-29 MED ORDER — BISACODYL 10 MG RE SUPP: 10.0000 mg | Freq: Every day | RECTAL | Status: DC | PRN

## 2014-07-29 MED ORDER — PHENOL 1.4 % MT LIQD: 1.0000 | OROMUCOSAL | Status: DC | PRN

## 2014-07-29 MED ORDER — METOCLOPRAMIDE HCL 10 MG PO TABS: 5.0000 mg | ORAL_TABLET | Freq: Three times a day (TID) | ORAL | Status: DC | PRN

## 2014-07-29 MED ORDER — DIPHENHYDRAMINE HCL 12.5 MG/5ML PO ELIX
12.5000 mg | ORAL_SOLUTION | ORAL | Status: DC | PRN
  Administered 2014-07-30: 25 mg via ORAL
  Administered 2014-07-30: 12.5 mg via ORAL
  Filled 2014-07-29 (×2): qty 10

## 2014-07-29 SURGICAL SUPPLY — 44 items
BLADE SAW SGTL 18X1.27X75 (BLADE) ×2 IMPLANT
BLADE SAW SGTL 18X1.27X75MM (BLADE) ×1
CAPT HIP TOTAL 2 ×3 IMPLANT
CLOSURE STERI-STRIP 1/2X4 (GAUZE/BANDAGES/DRESSINGS) ×1
CLSR STERI-STRIP ANTIMIC 1/2X4 (GAUZE/BANDAGES/DRESSINGS) ×3 IMPLANT
DECANTER SPIKE VIAL GLASS SM (MISCELLANEOUS) ×3 IMPLANT
DRAPE C-ARM 42X72 X-RAY (DRAPES) ×3 IMPLANT
DRAPE IMP U-DRAPE 54X76 (DRAPES) ×3 IMPLANT
DRAPE STERI IOBAN 125X83 (DRAPES) ×3 IMPLANT
DRAPE U-SHAPE 47X51 STRL (DRAPES) ×9 IMPLANT
DRSG MEPILEX BORDER 4X4 (GAUZE/BANDAGES/DRESSINGS) ×2 IMPLANT
DRSG MEPILEX BORDER 4X8 (GAUZE/BANDAGES/DRESSINGS) ×3 IMPLANT
DURAPREP 26ML APPLICATOR (WOUND CARE) ×3 IMPLANT
ELECT BLADE 6.5 EXT (BLADE) ×3 IMPLANT
ELECT REM PT RETURN 9FT ADLT (ELECTROSURGICAL) ×3
ELECTRODE REM PT RTRN 9FT ADLT (ELECTROSURGICAL) ×1 IMPLANT
EVACUATOR 1/8 PVC DRAIN (DRAIN) ×3 IMPLANT
FACESHIELD WRAPAROUND (MASK) ×6 IMPLANT
FACESHIELD WRAPAROUND OR TEAM (MASK) ×2 IMPLANT
GLOVE BIO SURGEON STRL SZ7.5 (GLOVE) ×3 IMPLANT
GLOVE BIO SURGEON STRL SZ8 (GLOVE) ×6 IMPLANT
GLOVE BIOGEL PI IND STRL 8 (GLOVE) ×2 IMPLANT
GLOVE BIOGEL PI INDICATOR 8 (GLOVE) ×4
GLOVE ECLIPSE 8.0 STRL XLNG CF (GLOVE) ×3 IMPLANT
GOWN STRL REUS W/ TWL LRG LVL3 (GOWN DISPOSABLE) ×1 IMPLANT
GOWN STRL REUS W/ TWL XL LVL3 (GOWN DISPOSABLE) ×1 IMPLANT
GOWN STRL REUS W/TWL LRG LVL3 (GOWN DISPOSABLE) ×3
GOWN STRL REUS W/TWL XL LVL3 (GOWN DISPOSABLE) ×3
KIT BASIN OR (CUSTOM PROCEDURE TRAY) ×3 IMPLANT
NDL SAFETY ECLIPSE 18X1.5 (NEEDLE) ×1 IMPLANT
NEEDLE HYPO 18GX1.5 SHARP (NEEDLE) ×3
PACK TOTAL JOINT (CUSTOM PROCEDURE TRAY) ×3 IMPLANT
PACK UNIVERSAL I (CUSTOM PROCEDURE TRAY) ×3 IMPLANT
SUT ETHIBOND NAB CT1 #1 30IN (SUTURE) ×3 IMPLANT
SUT MNCRL AB 4-0 PS2 18 (SUTURE) ×3 IMPLANT
SUT VIC AB 1 CT1 27 (SUTURE) ×3
SUT VIC AB 1 CT1 27XBRD ANTBC (SUTURE) ×1 IMPLANT
SUT VIC AB 2-0 CT1 27 (SUTURE) ×6
SUT VIC AB 2-0 CT1 TAPERPNT 27 (SUTURE) ×2 IMPLANT
SUT VLOC 180 0 24IN GS25 (SUTURE) ×3 IMPLANT
SYR 20CC LL (SYRINGE) ×3 IMPLANT
SYR 50ML LL SCALE MARK (SYRINGE) ×3 IMPLANT
TOWEL OR 17X26 10 PK STRL BLUE (TOWEL DISPOSABLE) ×6 IMPLANT
TRAY FOLEY CATH 16FR SILVER (SET/KITS/TRAYS/PACK) ×3 IMPLANT

## 2014-07-29 NOTE — Anesthesia Procedure Notes (Addendum)
Spinal Patient location during procedure: OR Start time: 07/29/2014 8:28 AM End time: 07/29/2014 8:38 AM Staffing Anesthesiologist: Alexis Frock Spinal Block Patient position: sitting Prep: Betadine Patient monitoring: cardiac monitor and blood pressure Approach: midline Location: L3-4 Injection technique: single-shot Needle Needle type: Pencil-Tip  Needle gauge: 24 G Needle length: 10 cm Assessment Sensory level: T10 Additional Notes Neg papathesias, marcaine .75% with dextrose 1.77ml  Procedure Name: LMA Insertion Date/Time: 07/29/2014 9:02 AM Performed by: Jacquiline Doe A Pre-anesthesia Checklist: Patient identified, Timeout performed, Emergency Drugs available, Suction available and Patient being monitored Patient Re-evaluated:Patient Re-evaluated prior to inductionOxygen Delivery Method: Circle system utilized Preoxygenation: Pre-oxygenation with 100% oxygen Intubation Type: IV induction Ventilation: Mask ventilation without difficulty LMA: LMA inserted LMA Size: 4.0 Tube type: Oral Number of attempts: 1 Placement Confirmation: ETT inserted through vocal cords under direct vision,  breath sounds checked- equal and bilateral and positive ETCO2 Tube secured with: Tape Dental Injury: Teeth and Oropharynx as per pre-operative assessment

## 2014-07-29 NOTE — Plan of Care (Signed)
Problem: Consults Goal: Diagnosis- Total Joint Replacement Primary Total Hip Left     

## 2014-07-29 NOTE — Transfer of Care (Signed)
Immediate Anesthesia Transfer of Care Note  Patient: Nancy Burton  Procedure(s) Performed: Procedure(s): LEFT TOTAL HIP ARTHROPLASTY ANTERIOR APPROACH (Left)  Patient Location: PACU  Anesthesia Type:General and Spinal  Level of Consciousness: awake, oriented, sedated, patient cooperative and responds to stimulation  Airway & Oxygen Therapy: Patient Spontanous Breathing and Patient connected to nasal cannula oxygen  Post-op Assessment: Report given to PACU RN, Post -op Vital signs reviewed and stable, Patient moving all extremities and Patient moving all extremities X 4  Post vital signs: Reviewed and stable  Complications: No apparent anesthesia complications

## 2014-07-29 NOTE — Op Note (Signed)
OPERATIVE REPORT  PREOPERATIVE DIAGNOSIS: Osteoarthritis of the Left hip.   POSTOPERATIVE DIAGNOSIS: Osteoarthritis of the Left  hip.   PROCEDURE: Left total hip arthroplasty, anterior approach.   SURGEON: Gaynelle Arabian, MD   ASSISTANT: [name], PA-C  ANESTHESIA:  General and Spinal  ESTIMATED BLOOD LOSS:-400 ml   DRAINS: Hemovac x1.   COMPLICATIONS: None   CONDITION: PACU - hemodynamically stable.   BRIEF CLINICAL NOTE: Nancy Burton is a 59 y.o. female who has advanced end-  stage arthritis of her Left  hip with progressively worsening pain and  dysfunction.The patient has failed nonoperative management and presents for  total hip arthroplasty.   PROCEDURE IN DETAIL: After successful administration of spinal  anesthetic, the traction boots for the [facility] bed were placed on both  feet and the patient was placed onto the [facility] bed, boots placed into the leg  holders. The Left hip was then isolated from the perineum with plastic  drapes and prepped and draped in the usual sterile fashion. ASIS and  greater trochanter were marked and a oblique incision was made, starting  at about 1 cm lateral and 2 cm distal to the ASIS and coursing towards  the anterior cortex of the femur. The skin was cut with a 10 blade  through subcutaneous tissue to the level of the fascia overlying the  tensor fascia lata muscle. The fascia was then incised in line with the  incision at the junction of the anterior third and posterior 2/3rd. The  muscle was teased off the fascia and then the interval between the TFL  and the rectus was developed. The Hohmann retractor was then placed at  the top of the femoral neck over the capsule. The vessels overlying the  capsule were cauterized and the fat on top of the capsule was removed.  A Hohmann retractor was then placed anterior underneath the rectus  femoris to give exposure to the entire anterior capsule. A T-shaped  capsulotomy was  performed. The edges were tagged and the femoral head  was identified.       Osteophytes are removed off the superior acetabulum.  The femoral neck was then cut in situ with an oscillating saw. Traction  was then applied to the left lower extremity utilizing the [facility]  traction. The femoral head was then removed. Retractors were placed  around the acetabulum and then circumferential removal of the labrum was  performed. Osteophytes were also removed. Reaming starts at 45 mm to  medialize and  Increased in 2 mm increments to 49 mm. We reamed in  approximately 40 degrees of abduction, 20 degrees anteversion. A 50 mm  pinnacle acetabular shell was then impacted in anatomic position under  fluoroscopic guidance with excellent purchase. We did not need to place  any additional dome screws. A 32 mm neutral + 4 marathon liner was then  placed into the acetabular shell.       The femoral lift was then placed along the lateral aspect of the femur  just distal to the vastus ridge. The leg was  externally rotated and capsule  was stripped off the inferior aspect of the femoral neck down to the  level of the lesser trochanter, this was done with electrocautery. The femur was lifted after this was performed. The  leg was then placed and extended in adducted position to essentially delivering the femur. We also removed the capsule superiorly and the  piriformis from  the piriformis fossa to gain excellent exposure of the  proximal femur. Rongeur was used to remove some cancellous bone to get  into the lateral portion of the proximal femur for placement of the  initial starter reamer. The starter broaches was placed  the starter broach  and was shown to go down the center of the canal. Broaching  with the  Corail system was then performed starting at size 8, coursing  Up to size 10. A size 10 had excellent torsional and rotational  and axial stability. The trial standard offset neck was then placed  with a  32 + 9 trial head. The hip was then reduced. We confirmed that  the stem was in the canal both on AP and lateral x-rays. It also has excellent sizing. The hip was reduced with outstanding stability through full extension, full external rotation,  and then flexion in adduction internal rotation. AP pelvis was taken  and the leg lengths were measured and found to be exactly equal. Hip  was then dislocated again and the femoral head and neck removed. The  femoral broach was removed. Size 10 Corail stem with a standard offset  neck was then impacted into the femur following native anteversion. Has  excellent purchase in the canal. Excellent torsional and rotational and  axial stability. It is confirmed to be in the canal on AP and lateral  fluoroscopic views. The 32 + 9 ceramic head was placed and the hip  reduced with outstanding stability. Again AP pelvis was taken and it  confirmed that the leg lengths were equal. The wound was then copiously  irrigated with saline solution and the capsule reattached and repaired  with Ethibond suture.  20 mL of Exparel mixed with 50 mL of saline then additional 20 ml of .25% Bupivicaine injected into the capsule and into the edge of the tensor fascia lata as well as subcutaneous tissue. The fascia overlying the tensor fascia lata was  then closed with a running #1 V-Loc. Subcu was closed with interrupted  2-0 Vicryl and subcuticular running 4-0 Monocryl. Incision was cleaned  and dried. Steri-Strips and a bulky sterile dressing applied. Hemovac  drain was hooked to suction and then he was awakened and transported to  recovery in stable condition.        Please note that a surgical assistant was a medical necessity for this procedure to perform it in a safe and expeditious manner. Assistant was necessary to provide appropriate retraction of vital neurovascular structures and to prevent femoral fracture and allow for anatomic placement of the prosthesis.  Gaynelle Arabian, M.D.

## 2014-07-29 NOTE — Interval H&P Note (Signed)
History and Physical Interval Note:  07/29/2014 7:12 AM  Nancy Burton  has presented today for surgery, with the diagnosis of OA LEFT HIP  The various methods of treatment have been discussed with the patient and family. After consideration of risks, benefits and other options for treatment, the patient has consented to  Procedure(s): LEFT TOTAL HIP ARTHROPLASTY ANTERIOR APPROACH (Left) as a surgical intervention .  The patient's history has been reviewed, patient examined, no change in status, stable for surgery.  I have reviewed the patient's chart and labs.  Questions were answered to the patient's satisfaction.     Gearlean Alf

## 2014-07-29 NOTE — Anesthesia Postprocedure Evaluation (Signed)
  Anesthesia Post-op Note  Patient: Nancy Burton  Procedure(s) Performed: Procedure(s): LEFT TOTAL HIP ARTHROPLASTY ANTERIOR APPROACH (Left)  Patient Location: PACU  Anesthesia Type:General  Level of Consciousness: awake and alert   Airway and Oxygen Therapy: Patient Spontanous Breathing and Patient connected to nasal cannula oxygen  Post-op Pain: mild  Post-op Assessment: Post-op Vital signs reviewed, Patient's Cardiovascular Status Stable, Patent Airway and No signs of Nausea or vomiting  Post-op Vital Signs: Reviewed and stable  Last Vitals:  Filed Vitals:   07/29/14 1030  BP: 110/63  Pulse:   Temp: 36.3 C  Resp: 26    Complications: No apparent anesthesia complications

## 2014-07-29 NOTE — Evaluation (Signed)
Physical Therapy Evaluation Patient Details Name: Nancy Burton MRN: [id number] DOB: 09-26-1955 Today's Date: 07/29/2014   History of Present Illness  s/p Left total hip arthroplasty, anterior approach.   Clinical Impression  Patient presents with pain and weakness in LLE s/p above surgery. Pt tolerated ambulation and transfers with supervision for safety- only complaint if numbness left foot and buttock. Instructed pt in HEP. Would benefit from stair training next session prior to discharge. Encouraged ambulation 3x/day with supervision. Pt would benefit from skilled PT to improve overall functional mobility so pt can return to PLOF and d/c home this weekend.    Follow Up Recommendations Supervision - Intermittent;Outpatient PT (when cleared by MD.)    Equipment Recommendations  Rolling walker with 5" wheels    Recommendations for Other Services       Precautions / Restrictions Precautions Precautions: Fall Restrictions Weight Bearing Restrictions: Yes LLE Weight Bearing: Weight bearing as tolerated      Mobility  Bed Mobility Overal bed mobility: Needs Assistance Bed Mobility: Rolling;Supine to Sit Rolling: Supervision   Supine to sit: Supervision     General bed mobility comments: Received sitting in chair upon PT arrival.   Transfers Overall transfer level: Needs assistance Equipment used: Rolling walker (2 wheeled) Transfers: Sit to/from Stand Sit to Stand: Supervision Stand pivot transfers: Min assist       General transfer comment: Supervision for safety. Stood from [employer].   Ambulation/Gait Ambulation/Gait assistance: Supervision Ambulation Distance (Feet): 150 Feet Assistive device: Rolling walker (2 wheeled) Gait Pattern/deviations: Step-through pattern;Decreased stride length Gait velocity: decreased   General Gait Details: Pt with slow, steady gait,. Demonstrates step through gait pattern, encouraged knee flexion during swing phase on left  and upright posture.  Stairs            Wheelchair Mobility    Modified Rankin (Stroke Patients Only)       Balance Overall balance assessment: Needs assistance Sitting-balance support: Feet supported;No upper extremity supported Sitting balance-Leahy Scale: Good     Standing balance support: During functional activity Standing balance-Leahy Scale: Fair Standing balance comment: secondary to numbness > LLE and buttock                             Pertinent Vitals/Pain Pain Assessment: 0-10 Pain Score: 2  Pain Location: LLE along incision Pain Descriptors / Indicators: Aching Pain Intervention(s): Monitored during session;Repositioned;Premedicated before session    Home Living Family/patient expects to be discharged to:: Private residence Living Arrangements: Spouse/significant other Available Help at Discharge: Family;Available 24 hours/day Type of Home: House Home Access: Stairs to enter Entrance Stairs-Rails: None Entrance Stairs-Number of Steps: 2 Home Layout: Multi-level;Able to live on main level with bedroom/bathroom Home Equipment: Bedside commode;Walker - standard;Cane - single point;Crutches Additional Comments: reports can borrow a BSC    Prior Function Level of Independence: Independent with assistive device(s)         Comments: Pt using SPC vs crutch ~1 month prior to surgery. Prior to this, pt very active doing workout classes.     Hand Dominance   Dominant Hand: Right    Extremity/Trunk Assessment   Upper Extremity Assessment: Defer to OT evaluation           Lower Extremity Assessment: LLE deficits/detail   LLE Deficits / Details: Lacking full AROM hip flexion, knee extension compared to RLE. However grossly ~3/5 throughout.  Cervical / Trunk Assessment: Normal  Communication   Communication:  No difficulties  Cognition Arousal/Alertness: Awake/alert Behavior During Therapy: WFL for tasks assessed/performed Overall  Cognitive Status: Within Functional Limits for tasks assessed                      General Comments General comments (skin integrity, edema, etc.): Education provided on importance of listening to her body and not "over doing it" with regards to exercise and activity. instructed pt to ambulate 3x/day with supervision.     Exercises Total Joint Exercises Ankle Circles/Pumps: Both;15 reps;Seated Hip ABduction/ADduction: Both;10 reps;Seated Long Arc Quad: Both;10 reps;Seated Marching in Standing: Both;5 reps;Seated (marches in seated.)      Assessment/Plan    PT Assessment Patient needs continued PT services  PT Diagnosis Acute pain;Generalized weakness;Difficulty walking   PT Problem List Decreased strength;Pain;Decreased range of motion;Impaired sensation;Decreased mobility;Decreased balance  PT Treatment Interventions Balance training;Gait training;Stair training;Patient/family education;Therapeutic exercise;Therapeutic activities   PT Goals (Current goals can be found in the Care Plan section) Acute Rehab PT Goals Patient Stated Goal: for my left leg to work like my right leg. PT Goal Formulation: With patient Time For Goal Achievement: 08/12/14 Potential to Achieve Goals: Good    Frequency 7X/week (BID)   Barriers to discharge        Co-evaluation               End of Session Equipment Utilized During Treatment: Gait belt Activity Tolerance: Patient tolerated treatment well Patient left: in chair;with call bell/phone within reach Nurse Communication: Mobility status         Time: 1352-1415 PT Time Calculation (min) (ACUTE ONLY): 23 min   Charges:   PT Evaluation $Initial PT Evaluation Tier I: 1 Procedure PT Treatments $Gait Training: 8-22 mins   PT G CodesCandy Sledge A 08-22-14, 2:27 PM Candy Sledge, [place], DPT [phone]

## 2014-07-29 NOTE — Evaluation (Signed)
Occupational Therapy Evaluation Patient Details Name: MOMOKA STRINGFIELD MRN: [id number] DOB: 05-Oct-1955 Today's Date: 07/29/2014    History of Present Illness s/p Left total hip arthroplasty, anterior approach.    Clinical Impression   Patient independent PTA. Patient currently functioning an an overall min assist level. Patient will benefit from acute OT to increase overall independence in the areas of ADLs, functional mobility, functional transfers, and safey in order to safely discharge home with husband who can provide 24/7 supervision/assistance prn.     Follow Up Recommendations  No OT follow up;Supervision/Assistance - 24 hour    Equipment Recommendations  None recommended by OT    Recommendations for Other Services  None at this time     Precautions / Restrictions Precautions Precautions: Fall Restrictions Weight Bearing Restrictions: Yes LLE Weight Bearing: Weight bearing as tolerated      Mobility Bed Mobility Overal bed mobility: Needs Assistance Bed Mobility: Rolling;Supine to Sit Rolling: Supervision   Supine to sit: Supervision     General bed mobility comments: using bed rails  Transfers Overall transfer level: Needs assistance Equipment used: Rolling walker (2 wheeled) Transfers: Sit to/from [employer] Sit to Stand: Min assist Stand pivot transfers: Min assist       General transfer comment: Patient requires assistance secondary to numbness and tingling > LLE and buttock    Balance Overall balance assessment: Needs assistance Sitting-balance support: No upper extremity supported Sitting balance-Leahy Scale: Good     Standing balance support: Bilateral upper extremity supported Standing balance-Leahy Scale: Fair Standing balance comment: secondary to numbness > LLE and buttock     ADL Overall ADL's : Needs assistance/impaired     Grooming: Min guard;Cueing for safety;Standing   Upper Body Bathing: Set up;Sitting    Lower Body Bathing: Min guard;Sit to/from stand;Cueing for safety   Upper Body Dressing : Set up;Sitting   Lower Body Dressing: Min guard;Sit to/from stand;Cueing for safety   Toilet Transfer: Minimal assistance;Stand-pivot;RW;BSC             General ADL Comments: Patient with complaints of numbness and tingling throughout LLE and buttock. Patient requires cueing for correct sit<>stand technique during ADL using RW. Patient able to bring BLEs to self for LB ADLs, no hip precautions per orders.                Pertinent Vitals/Pain Pain Assessment: 0-10 Pain Score: 3  Pain Location: LLE along incision Pain Descriptors / Indicators: Aching Pain Intervention(s): Monitored during session;Repositioned     Hand Dominance Right   Extremity/Trunk Assessment Upper Extremity Assessment Upper Extremity Assessment: Overall WFL for tasks assessed   Lower Extremity Assessment Lower Extremity Assessment: Defer to PT evaluation   Cervical / Trunk Assessment Cervical / Trunk Assessment: Normal   Communication Communication Communication: No difficulties   Cognition Arousal/Alertness: Awake/alert Behavior During Therapy: WFL for tasks assessed/performed Overall Cognitive Status: Within Functional Limits for tasks assessed              Home Living Family/patient expects to be discharged to:: Private residence Living Arrangements: Spouse/significant other Available Help at Discharge: Family;Available 24 hours/day Type of Home: House Home Access: Stairs to enter [employer] of Steps: 2 Entrance Stairs-Rails: None Home Layout: Multi-level;Able to live on main level with bedroom/bathroom Alternate Level Stairs-Number of Steps: flight Alternate Level Stairs-Rails: Left Bathroom Shower/Tub: Walk-in shower;Curtain   Bathroom Toilet: Standard     Home Equipment: Bedside commode;Walker - standard   Additional Comments: reports can borrow a BSC  Prior  Functioning/Environment Level of Independence: Independent             OT Diagnosis: Generalized weakness;Acute pain   OT Problem List: Decreased strength;Decreased activity tolerance;Impaired balance (sitting and/or standing);Decreased safety awareness;Decreased knowledge of use of DME or AE;Pain   OT Treatment/Interventions: Self-care/ADL training;Therapeutic exercise;Energy conservation;DME and/or AE instruction;Therapeutic activities;Patient/family education;Balance training    OT Goals(Current goals can be found in the care plan section) Acute Rehab OT Goals Patient Stated Goal: numbness to go away OT Goal Formulation: With patient Time For Goal Achievement: 08/12/14 Potential to Achieve Goals: Good ADL Goals Pt Will Perform Grooming: with modified independence;standing Pt Will Perform Lower Body Bathing: with modified independence;sit to/from stand Pt Will Perform Lower Body Dressing: with modified independence;sit to/from stand Pt Will Transfer to Toilet: with modified independence;ambulating Pt Will Perform Tub/Shower Transfer: with modified independence;3 in 1  OT Frequency: Min 2X/week   Barriers to D/C: None known at this time          End of Session Equipment Utilized During Treatment: Gait belt;Rolling walker Nurse Communication: Mobility status  Activity Tolerance: Patient tolerated treatment well Patient left: in chair;with call bell/phone within reach;with family/visitor present   Time: 1152-1218 OT Time Calculation (min): 26 min Charges:  OT General Charges $OT Visit: 1 Procedure OT Evaluation $Initial OT Evaluation Tier I: 1 Procedure OT Treatments $Therapeutic Activity: 8-22 mins  Felesha Moncrieffe , MS, OTR/L, CLT Pager: [phone]  07/29/2014, 1:14 PM

## 2014-07-30 LAB — BASIC METABOLIC PANEL
Anion gap: 5 (ref 5–15)
BUN: 8 mg/dL (ref 6–23)
CO2: 28 mmol/L (ref 19–32)
CREATININE: 0.75 mg/dL (ref 0.50–1.10)
Calcium: 8.5 mg/dL (ref 8.4–10.5)
Chloride: 104 mmol/L (ref 96–112)
GFR calc non Af Amer: >90 mL/min (ref >90)
Glucose, Bld: 111 mg/dL — ABNORMAL HIGH (ref 70–99)
Potassium: 4.2 mmol/L (ref 3.5–5.1)
Sodium: 137 mmol/L (ref 135–145)

## 2014-07-30 LAB — CBC
HCT: 28.9 % — ABNORMAL LOW (ref 36.0–46.0)
HEMOGLOBIN: 9.8 g/dL — AB (ref 12.0–15.0)
MCH: 31.2 pg (ref 26.0–34.0)
MCHC: 33.9 g/dL (ref 30.0–36.0)
MCV: 92 fL (ref 78.0–100.0)
PLATELETS: 276 10*3/uL (ref 150–400)
RBC: 3.14 MIL/uL — ABNORMAL LOW (ref 3.87–5.11)
RDW: 12.5 % (ref 11.5–15.5)
WBC: 10.5 10*3/uL (ref 4.0–10.5)

## 2014-07-30 MED ORDER — FERROUS SULFATE 325 (65 FE) MG PO TABS
325.0000 mg | ORAL_TABLET | Freq: Two times a day (BID) | ORAL | Status: DC
  Administered 2014-07-30 – 2014-07-31 (×3): 325 mg via ORAL
  Filled 2014-07-30 (×5): qty 1

## 2014-07-30 NOTE — Progress Notes (Signed)
PT Cancellation Note  Patient Details Name: Nancy Burton MRN: [id number] DOB: Oct 20, 1955   Cancelled Treatment:    Reason Eval/Treat Not Completed: Other (comment) (Declined as she states she is fine, doesn't need PT)   Ramond Dial 07/30/2014, 4:18 PM   Mee Hives, PT MS Acute Rehab Dept. Number: [phone]

## 2014-07-30 NOTE — Discharge Instructions (Addendum)
Dr. Gaynelle Arabian Total Joint Specialist [facility] [address] [place],  [address] [phone]    ANTERIOR APPROACH TOTAL HIP REPLACEMENT POSTOPERATIVE DIRECTIONS   Hip Rehabilitation, Guidelines Following Surgery  The results of a hip operation are greatly improved after range of motion and muscle strengthening exercises. Follow all safety measures which are given to protect your hip. If any of these exercises cause increased pain or swelling in your joint, decrease the amount until you are comfortable again. Then slowly increase the exercises. Call your caregiver if you have problems or questions.  HOME CARE INSTRUCTIONS  Most of the following instructions are designed to prevent the dislocation of your new hip.  Remove items at home which could result in a fall. This includes throw rugs or furniture in walking pathways.  Continue medications as instructed at time of discharge.  You may have some home medications which will be placed on hold until you complete the course of blood thinner medication.  You may start showering once you are discharged home but do not submerge the incision under water. Just pat the incision dry and apply a dry gauze dressing on daily. Do not put on socks or shoes without following the instructions of your caregivers.  Sit on high chairs which makes it easier to stand.  Sit on chairs with arms. Use the chair arms to help push yourself up when arising.  Keep your leg on the side of the operation out in front of you when standing up.  Arrange for the use of a toilet seat elevator so you are not sitting low.    Walk with walker as instructed.  You may resume a sexual relationship in one month or when given the OK by your caregiver.  Use walker as long as suggested by your caregivers.  You may put full weight on your legs and walk as much as is comfortable. Avoid periods of inactivity such as sitting longer than an hour  when not asleep. This helps prevent blood clots.  You may return to work once you are cleared by Engineer, production.  Do not drive a car for 6 weeks or until released by your surgeon.  Do not drive while taking narcotics.  Wear elastic stockings for three weeks following surgery during the day but you may remove then at night.  Make sure you keep all of your appointments after your operation with all of your doctors and caregivers. You should call the office at the above phone number and make an appointment for approximately two weeks after the date of your surgery. Change the dressing daily and reapply a dry dressing each time. Please pick up a stool softener and laxative for home use as long as you are requiring pain medications.  ICE to the affected hip every three hours for 30 minutes at a time and then as needed for pain and swelling.  Continue to use ice on the hip for pain and swelling from surgery. You may notice swelling that will progress down to the foot and ankle.  This is normal after surgery.  Elevate the leg when you are not up walking on it.   It is important for you to complete the blood thinner medication as prescribed by your doctor.  Continue to use the breathing machine which will help keep your temperature down.  It is common for your temperature to cycle up and down following surgery, especially at night when you are not up moving around  and exerting yourself.  The breathing machine keeps your lungs expanded and your temperature down. ° °RANGE OF MOTION AND STRENGTHENING EXERCISES  °These exercises are designed to help you keep full movement of your hip joint. Follow your caregiver's or physical therapist's instructions. Perform all exercises about fifteen times, three times per day or as directed. Exercise both hips, even if you have had only one joint replacement. These exercises can be done on a training (exercise) mat, on the floor, on a table or on a bed. Use whatever works the best  and is most comfortable for you. Use music or television while you are exercising so that the exercises are a pleasant break in your day. This will make your life better with the exercises acting as a break in routine you can look forward to.  °Lying on your back, slowly slide your foot toward your buttocks, raising your knee up off the floor. Then slowly slide your foot back down until your leg is straight again.  °Lying on your back spread your legs as far apart as you can without causing discomfort.  °Lying on your side, raise your upper leg and foot straight up from the floor as far as is comfortable. Slowly lower the leg and repeat.  °Lying on your back, tighten up the muscle in the front of your thigh (quadriceps muscles). You can do this by keeping your leg straight and trying to raise your heel off the floor. This helps strengthen the largest muscle supporting your knee.  °Lying on your back, tighten up the muscles of your buttocks both with the legs straight and with the knee bent at a comfortable angle while keeping your heel on the floor.  ° °SKILLED REHAB INSTRUCTIONS: °If the patient is transferred to a skilled rehab facility following release from the hospital, a list of the current medications will be sent to the facility for the patient to continue.  When discharged from the skilled rehab facility, please have the facility set up the patient's Home Health Physical Therapy prior to being released. Also, the skilled facility will be responsible for providing the patient with their medications at time of release from the facility to include their pain medication, the muscle relaxants, and their blood thinner medication. If the patient is still at the rehab facility at time of the two week follow up appointment, the skilled rehab facility will also need to assist the patient in arranging follow up appointment in our office and any transportation needs. ° °MAKE SURE YOU:  °Understand these instructions.    °Will watch your condition.  °Will get help right away if you are not doing well or get worse. ° °Pick up stool softner and laxative for home use following surgery while on pain medications. °Do not submerge incision under water. °Please use good hand washing techniques while changing dressing each day. °May shower starting three days after surgery. °Please use a clean towel to pat the incision dry following showers. °Continue to use ice for pain and swelling after surgery. °Do not use any lotions or creams on the incision until instructed by your surgeon. °Total Hip Protocol. ° °Take Xarelto for two and a half more weeks, then discontinue Xarelto. °Once the patient has completed the blood thinner regimen, then take a Baby 81 mg Aspirin daily for three more weeks. ° ° °Postoperative Constipation Protocol ° °Constipation - defined medically as fewer than three stools per week and severe constipation as less than one stool per week. ° °  One of the most common issues patients have following surgery is constipation.  Even if you have a regular bowel pattern at home, your normal regimen is likely to be disrupted due to multiple reasons following surgery.  Combination of anesthesia, postoperative narcotics, change in appetite and fluid intake all can affect your bowels.  In order to avoid complications following surgery, here are some recommendations in order to help you during your recovery period.  Colace (docusate) - Pick up an over-the-counter form of Colace or another stool softener and take twice a day as long as you are requiring postoperative pain medications.  Take with a full glass of water daily.  If you experience loose stools or diarrhea, hold the colace until you stool forms back up.  If your symptoms do not get better within 1 week or if they get worse, check with your doctor.  Dulcolax (bisacodyl) - Pick up over-the-counter and take as directed by the product packaging as needed to assist with the  movement of your bowels.  Take with a full glass of water.  Use this product as needed if not relieved by Colace only.   MiraLax (polyethylene glycol) - Pick up over-the-counter to have on hand.  MiraLax is a solution that will increase the amount of water in your bowels to assist with bowel movements.  Take as directed and can mix with a glass of water, juice, soda, coffee, or tea.  Take if you go more than two days without a movement. Do not use MiraLax more than once per day. Call your doctor if you are still constipated or irregular after using this medication for 7 days in a row.  If you continue to have problems with postoperative constipation, please contact the office for further assistance and recommendations.  If you experience "the worst abdominal pain ever" or develop nausea or vomiting, please contact the office immediatly for further recommendations for treatment.   Information on my medicine - XARELTO (Rivaroxaban)  This medication education was reviewed with me or my healthcare representative as part of my discharge preparation.  The pharmacist that spoke with me during my hospital stay was:  Arty Baumgartner, [facility]  Why was Xarelto prescribed for you? Xarelto was prescribed for you to reduce the risk of blood clots forming after orthopedic surgery. The medical term for these abnormal blood clots is venous thromboembolism (VTE).  What do you need to know about xarelto ? Take your Xarelto ONCE DAILY at the same time every day. You may take it either with or without food.  If you have difficulty swallowing the tablet whole, you may crush it and mix in applesauce just prior to taking your dose.  Take Xarelto exactly as prescribed by your doctor and DO NOT stop taking Xarelto without talking to the doctor who prescribed the medication.  Stopping without other VTE prevention medication to take the place of Xarelto may increase your risk of developing a clot.  After  discharge, you should have regular check-up appointments with your healthcare provider that is prescribing your Xarelto.    What do you do if you miss a dose? If you miss a dose, take it as soon as you remember on the same day then continue your regularly scheduled once daily regimen the next day. Do not take two doses of Xarelto on the same day.   Important Safety Information A possible side effect of Xarelto is bleeding. You should call your healthcare provider right away if you experience  any of the following: ? Bleeding from an injury or your nose that does not stop. ? Unusual colored urine (red or dark brown) or unusual colored stools (red or black). ? Unusual bruising for unknown reasons. ? A serious fall or if you hit your head (even if there is no bleeding).  Some medicines may interact with Xarelto and might increase your risk of bleeding while on Xarelto. To help avoid this, consult your healthcare provider or pharmacist prior to using any new prescription or non-prescription medications, including herbals, vitamins, non-steroidal anti-inflammatory drugs (NSAIDs) and supplements.  This website has more information on Xarelto: [email or web address]

## 2014-07-30 NOTE — Progress Notes (Addendum)
    Subjective: 1 Day Post-Op Procedure(s) (LRB): LEFT TOTAL HIP ARTHROPLASTY ANTERIOR APPROACH (Left) Patient reports pain as 2 on 0-10 scale.   Denies CP or SOB.  Voiding without difficulty. Positive flatus. Objective: Vital signs in last 24 hours: Temp:  [97.3 F (36.3 C)-98.2 F (36.8 C)] 98.2 F (36.8 C) (01/23 0450) Pulse Rate:  [64-96] 96 (01/23 0450) Resp:  [12-26] 16 (01/23 0450) BP: (99-119)/(50-64) 118/62 mmHg (01/23 0450) SpO2:  [92 %-100 %] 99 % (01/23 0450)  Intake/Output from previous day: 01/22 0701 - 01/23 0700 In: 2060 [P.O.:360; I.V.:1700] Out: 5060 [Urine:4550; Drains:110; Blood:400] Intake/Output this shift:    Labs:  Recent Labs  07/30/14 0452  HGB 9.8*    Recent Labs  07/30/14 0452  WBC 10.5  RBC 3.14*  HCT 28.9*  PLT 276    Recent Labs  07/30/14 0452  NA 137  K 4.2  CL 104  CO2 28  BUN 8  CREATININE 0.75  GLUCOSE 111*  CALCIUM 8.5   No results for input(s): LABPT, INR in the last 72 hours.  Physical Exam: Neurologically intact ABD soft Neurovascular intact Intact pulses distally Dorsiflexion/Plantar flexion intact Incision: dressing C/D/I Compartment soft  Assessment/Plan: 1 Day Post-Op Procedure(s) (LRB): LEFT TOTAL HIP ARTHROPLASTY ANTERIOR APPROACH (Left) Advance diet Up with therapy D/C IV fluids  Doing well Progress per protocol HCT 28.9 - will start FeSO4 and monitor lab  Tawnee Clegg D for Dr. Melina Schools [facility] [phone] 07/30/2014, 8:30 AM     Robaxin for muscle spasms as well.

## 2014-07-30 NOTE — Progress Notes (Signed)
Occupational Therapy Treatment Patient Details Name: Nancy Burton MRN: [id number] DOB: 02/22/1956 Today's Date: 07/30/2014    History of present illness s/p Left total hip arthroplasty, anterior approach.    OT comments  Pt. Progressing well and is ready for d/c from acute OT.  Able to complete shower stall transfer, toileting, and bed mobility with S.  Husband present and also feels comfortable assisting pt. Both had no further questions.  Will notify OTR/L for d/c  Follow Up Recommendations  No OT follow up;Supervision/Assistance - 24 hour    Equipment Recommendations  None recommended by OT    Recommendations for Other Services      Precautions / Restrictions Precautions Precautions: Fall Restrictions LLE Weight Bearing: Weight bearing as tolerated       Mobility Bed Mobility Overal bed mobility: Modified Independent Bed Mobility: Supine to Sit     Supine to sit: Modified independent (Device/Increase time)        Transfers Overall transfer level: Needs assistance Equipment used: Rolling walker (2 wheeled) Transfers: Sit to/from [facility] Sit to Stand: Supervision Stand pivot transfers: Supervision            Balance     Sitting balance-Leahy Scale: Good       Standing balance-Leahy Scale: Fair                     ADL Overall ADL's : Needs assistance/impaired     Grooming: Min guard;Cueing for safety;Standing       Lower Body Bathing: Min guard;Sit to/from stand;Cueing for safety       Lower Body Dressing: Min guard;Sit to/from stand;Cueing for safety   Toilet Transfer: Supervision/safety;RW;Comfort height toilet;Grab bars   Toileting- Clothing Manipulation and Hygiene: Sit to/from stand;Supervision/safety   Tub/ Shower Transfer: Walk-in shower;Min guard;Cueing for sequencing;Anterior/posterior;Rolling walker   Functional mobility during ADLs: Min guard General ADL Comments: husband present and active in  session.  pt. performing bed mobility, toileting, and shower stall transfer safely      Vision                     Perception     Praxis      Cognition   Behavior During Therapy: WFL for tasks assessed/performed Overall Cognitive Status: Within Functional Limits for tasks assessed                       Extremity/Trunk Assessment               Exercises     Shoulder Instructions       General Comments      Pertinent Vitals/ Pain       Pain Assessment: 0-10 Pain Score: 6  Pain Location: LLE incision Pain Descriptors / Indicators: Aching Pain Intervention(s): Monitored during session;Repositioned  Home Living                                          Prior Functioning/Environment              Frequency Min 2X/week     Progress Toward Goals  OT Goals(current goals can now be found in the care plan section)  Progress towards OT goals: Progressing toward goals     Plan Discharge plan remains appropriate    Co-evaluation  End of Session Equipment Utilized During Treatment: Gait belt;Rolling walker   Activity Tolerance Patient tolerated treatment well   Patient Left in chair;with call bell/phone within reach;with family/visitor present   Nurse Communication          Time: 1145-1200 OT Time Calculation (min): 15 min  Charges: OT General Charges $OT Visit: 1 Procedure OT Treatments $Self Care/Home Management : 8-22 mins  Janice Coffin, COTA/L 07/30/2014, 12:08 PM

## 2014-07-31 LAB — CBC
HEMATOCRIT: 28.1 % — AB (ref 36.0–46.0)
Hemoglobin: 9.4 g/dL — ABNORMAL LOW (ref 12.0–15.0)
MCH: 31.3 pg (ref 26.0–34.0)
MCHC: 33.5 g/dL (ref 30.0–36.0)
MCV: 93.7 fL (ref 78.0–100.0)
PLATELETS: 293 10*3/uL (ref 150–400)
RBC: 3 MIL/uL — AB (ref 3.87–5.11)
RDW: 12.6 % (ref 11.5–15.5)
WBC: 12.4 10*3/uL — ABNORMAL HIGH (ref 4.0–10.5)

## 2014-07-31 LAB — BASIC METABOLIC PANEL
Anion gap: 8 (ref 5–15)
BUN: 9 mg/dL (ref 6–23)
CO2: 27 mmol/L (ref 19–32)
Calcium: 8.6 mg/dL (ref 8.4–10.5)
Chloride: 101 mmol/L (ref 96–112)
Creatinine, Ser: 0.72 mg/dL (ref 0.50–1.10)
GFR calc non Af Amer: >90 mL/min (ref >90)
Glucose, Bld: 125 mg/dL — ABNORMAL HIGH (ref 70–99)
Potassium: 4.1 mmol/L (ref 3.5–5.1)
SODIUM: 136 mmol/L (ref 135–145)

## 2014-07-31 MED ORDER — TRAMADOL HCL 50 MG PO TABS: 50.0000 mg | ORAL_TABLET | Freq: Four times a day (QID) | ORAL | Status: DC | PRN

## 2014-07-31 MED ORDER — RIVAROXABAN 10 MG PO TABS: 10.0000 mg | ORAL_TABLET | Freq: Every day | ORAL | Status: DC

## 2014-07-31 MED ORDER — OXYCODONE HCL 5 MG PO TABS: 5.0000 mg | ORAL_TABLET | ORAL | Status: DC | PRN

## 2014-07-31 MED ORDER — METHOCARBAMOL 500 MG PO TABS: 500.0000 mg | ORAL_TABLET | Freq: Four times a day (QID) | ORAL | Status: DC | PRN

## 2014-07-31 NOTE — Progress Notes (Signed)
   Subjective: 2 Days Post-Op Procedure(s) (LRB): LEFT TOTAL HIP ARTHROPLASTY ANTERIOR APPROACH (Left) Patient reports pain as mild.   Patient seen in rounds with Dr. Wynelle Link. Patient is well, and has had no acute complaints or problems Patient is ready to go home  Objective: Vital signs in last 24 hours: Temp:  [98.1 F (36.7 C)-98.2 F (36.8 C)] 98.2 F (36.8 C) (01/24 0512) Pulse Rate:  [87-114] 87 (01/24 0512) Resp:  [16-18] 18 (01/24 0512) BP: (114-139)/(62-78) 114/62 mmHg (01/24 0512) SpO2:  [98 %-99 %] 98 % (01/24 0512)  Intake/Output from previous day: No intake or output data in the 24 hours ending 07/31/14 0904   Labs:  Recent Labs  07/30/14 0452 07/31/14 0440  HGB 9.8* 9.4*    Recent Labs  07/30/14 0452 07/31/14 0440  WBC 10.5 12.4*  RBC 3.14* 3.00*  HCT 28.9* 28.1*  PLT 276 293    Recent Labs  07/30/14 0452 07/31/14 0440  NA 137 136  K 4.2 4.1  CL 104 101  CO2 28 27  BUN 8 9  CREATININE 0.75 0.72  GLUCOSE 111* 125*  CALCIUM 8.5 8.6   No results for input(s): LABPT, INR in the last 72 hours.  EXAM: General - Patient is Alert, Appropriate and Oriented Extremity - Neurovascular intact Sensation intact distally Dorsiflexion/Plantar flexion intact Incision - clean, dry, no drainage Motor Function - intact, moving foot and toes well on exam.   Assessment/Plan: 2 Days Post-Op Procedure(s) (LRB): LEFT TOTAL HIP ARTHROPLASTY ANTERIOR APPROACH (Left) Procedure(s) (LRB): LEFT TOTAL HIP ARTHROPLASTY ANTERIOR APPROACH (Left) Past Medical History  Diagnosis Date  . Hypertension   . Adenocarcinoma in situ (AIS) of uterine cervix 1983    Cone biopsy the cervix subsequent, TVH negative  . Osteopenia 06/2013    T score -1.4 FRAX 6.6%/0.4%  . PONV (postoperative nausea and vomiting)   . Arthritis   . Cancer     cervical   Principal Problem:   OA (osteoarthritis) of hip  Estimated body mass index is 24.53 kg/(m^2) as calculated from the  following:   Height as of this encounter: 5\' 4"  (1.626 m).   Weight as of this encounter: 64.864 kg (143 lb). Up with therapy Discharge home with home health Diet - Cardiac diet Follow up - in 2 weeks Activity - WBAT Disposition - Home Condition Upon Discharge - Good D/C Meds - See DC Summary DVT Prophylaxis - Xarelto  [name], PA-C Orthopaedic Surgery 07/31/2014, 9:04 AM

## 2014-07-31 NOTE — Discharge Summary (Signed)
Physician Discharge Summary   Patient ID: Nancy Burton MRN: [id number] DOB/AGE: 1955/11/24 59 y.o.  Admit date: 07/29/2014 Discharge date: 07/31/2014  Primary Diagnosis:  Osteoarthritis of the Left hip.  Admission Diagnoses:  Past Medical History  Diagnosis Date  . Hypertension   . Adenocarcinoma in situ (AIS) of uterine cervix 1983    Cone biopsy the cervix subsequent, TVH negative  . Osteopenia 06/2013    T score -1.4 FRAX 6.6%/0.4%  . PONV (postoperative nausea and vomiting)   . Arthritis   . Cancer     cervical   Discharge Diagnoses:   Principal Problem:   OA (osteoarthritis) of hip  Estimated body mass index is 24.53 kg/(m^2) as calculated from the following:   Height as of this encounter: $RemoveBeforeD'5\' 4"'[id number]$  (1.626 m).   Weight as of this encounter: 64.864 kg (143 lb).  Procedure(s) (LRB): LEFT TOTAL HIP ARTHROPLASTY ANTERIOR APPROACH (Left)   Consults: None  HPI: Nancy Burton is a 58 y.o. female who has advanced end-  stage arthritis of her Left hip with progressively worsening pain and  dysfunction.The patient has failed nonoperative management and presents for  total hip arthroplasty.  Laboratory Data: Admission on 07/29/2014, Discharged on 07/31/2014  Component Date Value Ref Range Status  . WBC 07/30/2014 10.5  4.0 - 10.5 K/uL Final  . RBC 07/30/2014 3.14* 3.87 - 5.11 MIL/uL Final  . Hemoglobin 07/30/2014 9.8* 12.0 - 15.0 g/dL Final  . HCT 07/30/2014 28.9* 36.0 - 46.0 % Final  . MCV 07/30/2014 92.0  78.0 - 100.0 fL Final  . MCH 07/30/2014 31.2  26.0 - 34.0 pg Final  . MCHC 07/30/2014 33.9  30.0 - 36.0 g/dL Final  . RDW 07/30/2014 12.5  11.5 - 15.5 % Final  . Platelets 07/30/2014 276  150 - 400 K/uL Final  . Sodium 07/30/2014 137  135 - 145 mmol/L Final  . Potassium 07/30/2014 4.2  3.5 - 5.1 mmol/L Final  . Chloride 07/30/2014 104  96 - 112 mmol/L Final  . CO2 07/30/2014 28  19 - 32 mmol/L Final  . Glucose, Bld 07/30/2014 111* 70 - 99 mg/dL Final  .  BUN 07/30/2014 8  6 - 23 mg/dL Final  . Creatinine, Ser 07/30/2014 0.75  0.50 - 1.10 mg/dL Final  . Calcium 07/30/2014 8.5  8.4 - 10.5 mg/dL Final  . GFR calc non Af Amer 07/30/2014 >90  >90 mL/min Final  . GFR calc Af Amer 07/30/2014 >90  >90 mL/min Final   Comment: (NOTE) The eGFR has been calculated using the CKD EPI equation. This calculation has not been validated in all clinical situations. eGFR's persistently <90 mL/min signify possible Chronic Kidney Disease.   . Anion gap 07/30/2014 5  5 - 15 Final  . WBC 07/31/2014 12.4* 4.0 - 10.5 K/uL Final  . RBC 07/31/2014 3.00* 3.87 - 5.11 MIL/uL Final  . Hemoglobin 07/31/2014 9.4* 12.0 - 15.0 g/dL Final  . HCT 07/31/2014 28.1* 36.0 - 46.0 % Final  . MCV 07/31/2014 93.7  78.0 - 100.0 fL Final  . MCH 07/31/2014 31.3  26.0 - 34.0 pg Final  . MCHC 07/31/2014 33.5  30.0 - 36.0 g/dL Final  . RDW 07/31/2014 12.6  11.5 - 15.5 % Final  . Platelets 07/31/2014 293  150 - 400 K/uL Final  . Sodium 07/31/2014 136  135 - 145 mmol/L Final  . Potassium 07/31/2014 4.1  3.5 - 5.1 mmol/L Final  . Chloride 07/31/2014 101  96 - 112 mmol/L  Final  . CO2 07/31/2014 27  19 - 32 mmol/L Final  . Glucose, Bld 07/31/2014 125* 70 - 99 mg/dL Final  . BUN 99/18/1127 9  6 - 23 mg/dL Final  . Creatinine, Ser 07/31/2014 0.72  0.50 - 1.10 mg/dL Final  . Calcium 93/68/2861 8.6  8.4 - 10.5 mg/dL Final  . GFR calc non Af Amer 07/31/2014 >90  >90 mL/min Final  . GFR calc Af Amer 07/31/2014 >90  >90 mL/min Final   Comment: (NOTE) The eGFR has been calculated using the CKD EPI equation. This calculation has not been validated in all clinical situations. eGFR's persistently <90 mL/min signify possible Chronic Kidney Disease.   Eustaquio Boyden gap 07/31/2014 8  5 - 15 Final  Hospital Outpatient Visit on 07/18/2014  Component Date Value Ref Range Status  . MRSA, PCR 07/18/2014 NEGATIVE  NEGATIVE Final  . Staphylococcus aureus 07/18/2014 NEGATIVE  NEGATIVE Final   Comment:          The Xpert SA Assay (FDA approved for NASAL specimens in patients over 93 years of age), is one component of a comprehensive surveillance program.  Test performance has been validated by [employer] for patients greater than or equal to 16 year old. It is not intended to diagnose infection nor to guide or monitor treatment.   Marland Kitchen aPTT 07/18/2014 34  24 - 37 seconds Final  . WBC 07/18/2014 5.8  4.0 - 10.5 K/uL Final  . RBC 07/18/2014 4.20  3.87 - 5.11 MIL/uL Final  . Hemoglobin 07/18/2014 12.7  12.0 - 15.0 g/dL Final  . HCT 37/06/4424 38.8  36.0 - 46.0 % Final  . MCV 07/18/2014 92.4  78.0 - 100.0 fL Final  . MCH 07/18/2014 30.2  26.0 - 34.0 pg Final  . MCHC 07/18/2014 32.7  30.0 - 36.0 g/dL Final  . RDW 00/20/0616 12.6  11.5 - 15.5 % Final  . Platelets 07/18/2014 337  150 - 400 K/uL Final  . Sodium 07/18/2014 137  135 - 145 mmol/L Final   Please note change in reference range.  . Potassium 07/18/2014 4.0  3.5 - 5.1 mmol/L Final   Please note change in reference range.  . Chloride 07/18/2014 104  96 - 112 mEq/L Final  . CO2 07/18/2014 28  19 - 32 mmol/L Final  . Glucose, Bld 07/18/2014 102* 70 - 99 mg/dL Final  . BUN 28/17/9588 23  6 - 23 mg/dL Final  . Creatinine, Ser 07/18/2014 0.84  0.50 - 1.10 mg/dL Final  . Calcium 43/65/6171 9.1  8.4 - 10.5 mg/dL Final  . Total Protein 07/18/2014 7.3  6.0 - 8.3 g/dL Final  . Albumin 18/09/354 4.5  3.5 - 5.2 g/dL Final  . AST 34/24/4488 34  0 - 37 U/L Final  . ALT 07/18/2014 41* 0 - 35 U/L Final  . Alkaline Phosphatase 07/18/2014 92  39 - 117 U/L Final  . Total Bilirubin 07/18/2014 0.8  0.3 - 1.2 mg/dL Final  . GFR calc non Af Amer 07/18/2014 75* >90 mL/min Final  . GFR calc Af Amer 07/18/2014 86* >90 mL/min Final   Comment: (NOTE) The eGFR has been calculated using the CKD EPI equation. This calculation has not been validated in all clinical situations. eGFR's persistently <90 mL/min signify possible Chronic Kidney Disease.   .  Anion gap 07/18/2014 5  5 - 15 Final  . Prothrombin Time 07/18/2014 12.9  11.6 - 15.2 seconds Final  . INR 07/18/2014 0.96  0.00 - 1.49  Final  . ABO/RH(D) 07/18/2014 O POS   Final  . Antibody Screen 07/18/2014 NEG   Final  . Sample Expiration 07/18/2014 08/01/2014   Final  . Color, Urine 07/18/2014 YELLOW  YELLOW Final  . APPearance 07/18/2014 CLEAR  CLEAR Final  . Specific Gravity, Urine 07/18/2014 1.011  1.005 - 1.030 Final  . pH 07/18/2014 5.0  5.0 - 8.0 Final  . Glucose, UA 07/18/2014 NEGATIVE  NEGATIVE mg/dL Final  . Hgb urine dipstick 07/18/2014 NEGATIVE  NEGATIVE Final  . Bilirubin Urine 07/18/2014 NEGATIVE  NEGATIVE Final  . Ketones, ur 07/18/2014 NEGATIVE  NEGATIVE mg/dL Final  . Protein, ur 07/18/2014 NEGATIVE  NEGATIVE mg/dL Final  . Urobilinogen, UA 07/18/2014 0.2  0.0 - 1.0 mg/dL Final  . Nitrite 07/18/2014 NEGATIVE  NEGATIVE Final  . Leukocytes, UA 07/18/2014 NEGATIVE  NEGATIVE Final   MICROSCOPIC NOT DONE ON URINES WITH NEGATIVE PROTEIN, BLOOD, LEUKOCYTES, NITRITE, OR GLUCOSE <1000 mg/dL.  . ABO/RH(D) 07/18/2014 O POS   Final     X-Rays:Dg Pelvis Portable  07/29/2014   CLINICAL DATA:  Arthritis.  Hypertension.  Hip replacement.  EXAM: PORTABLE PELVIS 1-2 VIEWS  COMPARISON:  None.  FINDINGS: Total left hip replacement with good anatomic alignment. Metallic hardware intact. No acute bony abnormality .  IMPRESSION: Total left hip replacement with good anatomic alignment.   Electronically Signed   By: Marcello Moores  Register   On: 07/29/2014 11:34   Dg C-arm 1-60 Min-no Report  07/29/2014   CLINICAL DATA: elective surgery   C-ARM 1-60 MINUTES  Fluoroscopy was utilized by the requesting physician.  No radiographic  interpretation.     EKG: Orders placed or performed during the hospital encounter of 07/18/14  . EKG 12-Lead  . EKG 12-Lead     Hospital Course: Patient was admitted to [facility] and taken to the OR and underwent the above state procedure without  complications.  Patient tolerated the procedure well and was later transferred to the recovery room and then to the orthopaedic floor for postoperative care.  They were given PO and IV analgesics for pain control following their surgery.  They were given 24 hours of postoperative antibiotics of      Anti-infectives    Start     Dose/Rate Route Frequency Ordered Stop   07/29/14 1430  ceFAZolin (ANCEF) IVPB 2 g/50 mL premix     2 g100 mL/hr over 30 Minutes Intravenous Every 6 hours 07/29/14 1115 07/29/14 2113   07/29/14 0600  ceFAZolin (ANCEF) IVPB 2 g/50 mL premix     2 g100 mL/hr over 30 Minutes Intravenous On call to O.R. 07/28/14 1325 07/29/14 0835     and started on DVT prophylaxis in the form of Xarelto.   PT and OT were ordered for total hip protocol.  The patient was allowed to be WBAT with therapy. Discharge planning was consulted to help with postop disposition and equipment needs.  Patient had a decent night on the evening of surgery.  They started to get up OOB with therapy on day one.  Hemovac drain was pulled without difficulty.  Continued to work with therapy into day two.  Dressing was changed on day two and the incision was healing well. Patient was seen in rounds and was ready to go home.   Discharge home with home health Diet - Cardiac diet Follow up - in 2 weeks Activity - WBAT Disposition - Home Condition Upon Discharge - Good D/C Meds - See DC Summary DVT Prophylaxis -  Xarelto     Medication List    STOP taking these medications        CALCIUM + D PO     fish oil-omega-3 fatty acids 1000 MG capsule     multivitamin capsule     naproxen sodium 220 MG tablet  Commonly known as:  ANAPROX      TAKE these medications        acetaminophen 500 MG tablet  Commonly known as:  TYLENOL  Take 1,000 mg by mouth every 6 (six) hours as needed.     lisinopril 10 MG tablet  Commonly known as:  PRINIVIL,ZESTRIL  Take 10 mg by mouth daily.     methocarbamol 500 MG  tablet  Commonly known as:  ROBAXIN  Take 1 tablet (500 mg total) by mouth every 6 (six) hours as needed for muscle spasms.     oxyCODONE 5 MG immediate release tablet  Commonly known as:  Oxy IR/ROXICODONE  Take 1-2 tablets (5-10 mg total) by mouth every 3 (three) hours as needed for breakthrough pain.     rivaroxaban 10 MG Tabs tablet  Commonly known as:  XARELTO  Take 1 tablet (10 mg total) by mouth daily with breakfast.     traMADol 50 MG tablet  Commonly known as:  ULTRAM  Take 1-2 tablets (50-100 mg total) by mouth every 6 (six) hours as needed for moderate pain.       Follow-up Information    Follow up with Gearlean Alf, MD. Schedule an appointment as soon as possible for a visit on 08/11/2014.   Specialty:  Orthopedic Surgery   Why:  Call [phone] tomorrow to setup appointment with Dr. Wynelle Link.   Contact information:   [address] [place] [address] [phone]       Signed: [name], PA-C Orthopaedic Surgery 08/09/2014, 10:25 AM

## 2014-07-31 NOTE — Progress Notes (Signed)
Discharge instructions and prescriptions reviewed with patient and husband. Questions and concerns denied at this time. IV removed with no complications. VS stable. Patient stated they were unwilling to wait for rolling walker to be delivered today. Will pick up from [place] phone number and address. Patient discharged via wheelchair with personal belongings, prescriptions, and discharge packet.

## 2014-08-01 ENCOUNTER — Encounter (HOSPITAL_COMMUNITY): Payer: Self-pay | Admitting: Orthopedic Surgery

## 2014-08-01 NOTE — Progress Notes (Signed)
CARE MANAGEMENT NOTE 08/01/2014  Patient:  Nancy Burton, Nancy Burton   Account Number:  [id number]  Date Initiated:  07/30/2014  Documentation initiated by:  [facility]  Subjective/Objective Assessment:     Action/Plan:   Anticipated DC Date:  07/31/2014   Anticipated DC Plan:  [place]  CM consult      [facility] Choice  HOME HEALTH   Choice offered to / List presented to:  C-1 Patient        [place] [facility]  [facility]      [place].   Status of service:  Completed, signed off Medicare Important Message given?   (If response is "NO", the following Medicare IM given date fields will be blank) Date Medicare IM given:   Medicare IM given by:   Date Additional Medicare IM given:   Additional Medicare IM given by:    Discharge Disposition:  [place]  Per UR Regulation:    If discussed at Long Length of Stay Meetings, dates discussed:    Comments:  08/01/2014 1039 NCM spoke to pt and offered choice for [facility]. Pt states she is agreeable to [facility] for [facility]. Pt states she has a walker at home. States she is ok without RW. Offered to fax info to [facility] to have RW shipped to home. States they attempted to go by [facility] store to pick up this weekend but home store was closed. Pt states she is getting along with walker. Jonnie Finner RN CCM Case Mgmt phone [phone]

## 2015-03-06 ENCOUNTER — Other Ambulatory Visit: Payer: Self-pay

## 2015-03-06 DIAGNOSIS — Z1231 Encounter for screening mammogram for malignant neoplasm of breast: Secondary | ICD-10-CM

## 2015-04-12 ENCOUNTER — Ambulatory Visit
Admission: RE | Admit: 2015-04-12 | Discharge: 2015-04-12 | Disposition: A | Discharge status: Home / Self Care | Payer: BLUE CROSS/BLUE SHIELD | Source: Ambulatory Visit

## 2015-04-12 DIAGNOSIS — Z1231 Encounter for screening mammogram for malignant neoplasm of breast: Secondary | ICD-10-CM

## 2015-05-04 ENCOUNTER — Encounter: Payer: Self-pay | Admitting: Gynecology

## 2015-05-04 ENCOUNTER — Ambulatory Visit (INDEPENDENT_AMBULATORY_CARE_PROVIDER_SITE_OTHER): Payer: BLUE CROSS/BLUE SHIELD | Admitting: Gynecology

## 2015-05-04 ENCOUNTER — Other Ambulatory Visit (HOSPITAL_COMMUNITY)
Admission: RE | Admit: 2015-05-04 | Discharge: 2015-05-04 | Disposition: A | Discharge status: Home / Self Care | Payer: BLUE CROSS/BLUE SHIELD | Source: Ambulatory Visit | Attending: Gynecology | Admitting: Gynecology

## 2015-05-04 VITALS — BP 140/84 | Ht 64.0 in | Wt 147.0 lb

## 2015-05-04 DIAGNOSIS — M8580 Other specified disorders of bone density and structure, unspecified site: Secondary | ICD-10-CM

## 2015-05-04 DIAGNOSIS — D069 Carcinoma in situ of cervix, unspecified: Secondary | ICD-10-CM

## 2015-05-04 DIAGNOSIS — Z01419 Encounter for gynecological examination (general) (routine) without abnormal findings: Secondary | ICD-10-CM

## 2015-05-04 LAB — TSH: TSH: 0.839 u[IU]/mL (ref 0.350–4.500)

## 2015-05-04 LAB — LIPID PANEL
CHOL/HDL RATIO: 3 ratio (ref <=5.0)
Cholesterol: 204 mg/dL — ABNORMAL HIGH (ref 125–200)
HDL: 68 mg/dL (ref >=46)
LDL CALC: 120 mg/dL (ref <130)
Triglycerides: 78 mg/dL (ref <150)
VLDL: 16 mg/dL (ref <30)

## 2015-05-04 LAB — COMPREHENSIVE METABOLIC PANEL
ALT: 22 U/L (ref 6–29)
AST: 21 U/L (ref 10–35)
Albumin: 4.3 g/dL (ref 3.6–5.1)
Alkaline Phosphatase: 82 U/L (ref 33–130)
BUN: 13 mg/dL (ref 7–25)
CHLORIDE: 102 mmol/L (ref 98–110)
CO2: 24 mmol/L (ref 20–31)
CREATININE: 0.73 mg/dL (ref 0.50–0.99)
Calcium: 9 mg/dL (ref 8.6–10.4)
GLUCOSE: 102 mg/dL — AB (ref 65–99)
POTASSIUM: 4.2 mmol/L (ref 3.5–5.3)
SODIUM: 137 mmol/L (ref 135–146)
Total Bilirubin: 0.5 mg/dL (ref 0.2–1.2)
Total Protein: 7 g/dL (ref 6.1–8.1)

## 2015-05-04 LAB — CBC WITH DIFFERENTIAL/PLATELET
Basophils Absolute: 0 10*3/uL (ref 0.0–0.1)
Basophils Relative: 0 % (ref 0–1)
EOS ABS: 0.1 10*3/uL (ref 0.0–0.7)
EOS PCT: 1 % (ref 0–5)
HCT: 37.5 % (ref 36.0–46.0)
Hemoglobin: 13 g/dL (ref 12.0–15.0)
LYMPHS ABS: 1.5 10*3/uL (ref 0.7–4.0)
Lymphocytes Relative: 29 % (ref 12–46)
MCH: 31.6 pg (ref 26.0–34.0)
MCHC: 34.7 g/dL (ref 30.0–36.0)
MCV: 91.2 fL (ref 78.0–100.0)
MPV: 8.3 fL — ABNORMAL LOW (ref 8.6–12.4)
Monocytes Absolute: 0.5 10*3/uL (ref 0.1–1.0)
Monocytes Relative: 9 % (ref 3–12)
NEUTROS PCT: 61 % (ref 43–77)
Neutro Abs: 3.2 10*3/uL (ref 1.7–7.7)
Platelets: 377 10*3/uL (ref 150–400)
RBC: 4.11 MIL/uL (ref 3.87–5.11)
RDW: 13.4 % (ref 11.5–15.5)
WBC: 5.2 10*3/uL (ref 4.0–10.5)

## 2015-05-04 NOTE — Addendum Note (Signed)
Addended by: Nelva Nay on: 05/04/2015 11:52 AM   Modules accepted: Orders

## 2015-05-04 NOTE — Patient Instructions (Signed)
Follow up for your bone density as scheduled.  You may obtain a copy of any labs that were done today by logging onto MyChart as outlined in the instructions provided with your AVS (after visit summary). The office will not call with normal lab results but certainly if there are any significant abnormalities then we will contact you.   Health Maintenance Adopting a healthy lifestyle and getting preventive care can go a long way to promote health and wellness. Talk with your health care provider about what schedule of regular examinations is right for you. This is a good chance for you to check in with your provider about disease prevention and staying healthy. In between checkups, there are plenty of things you can do on your own. Experts have done a lot of research about which lifestyle changes and preventive measures are most likely to keep you healthy. Ask your health care provider for more information. WEIGHT AND DIET  Eat a healthy diet  Be sure to include plenty of vegetables, fruits, low-fat dairy products, and lean protein.  Do not eat a lot of foods high in solid fats, added sugars, or salt.  Get regular exercise. This is one of the most important things you can do for your health.  Most adults should exercise for at least 150 minutes each week. The exercise should increase your heart rate and make you sweat (moderate-intensity exercise).  Most adults should also do strengthening exercises at least twice a week. This is in addition to the moderate-intensity exercise.  Maintain a healthy weight  Body mass index (BMI) is a measurement that can be used to identify possible weight problems. It estimates body fat based on height and weight. Your health care provider can help determine your BMI and help you achieve or maintain a healthy weight.  For females 28 years of age and older:   A BMI below 18.5 is considered underweight.  A BMI of 18.5 to 24.9 is normal.  A BMI of 25 to 29.9  is considered overweight.  A BMI of 30 and above is considered obese.  Watch levels of cholesterol and blood lipids  You should start having your blood tested for lipids and cholesterol at 60 years of age, then have this test every 5 years.  You may need to have your cholesterol levels checked more often if:  Your lipid or cholesterol levels are high.  You are older than 60 years of age.  You are at high risk for heart disease.  CANCER SCREENING   Lung Cancer  Lung cancer screening is recommended for adults 57-67 years old who are at high risk for lung cancer because of a history of smoking.  A yearly low-dose CT scan of the lungs is recommended for people who:  Currently smoke.  Have quit within the past 15 years.  Have at least a 30-pack-year history of smoking. A pack year is smoking an average of one pack of cigarettes a day for 1 year.  Yearly screening should continue until it has been 15 years since you quit.  Yearly screening should stop if you develop a health problem that would prevent you from having lung cancer treatment.  Breast Cancer  Practice breast self-awareness. This means understanding how your breasts normally appear and feel.  It also means doing regular breast self-exams. Let your health care provider know about any changes, no matter how small.  If you are in your 20s or 30s, you should have a clinical breast  exam (CBE) by a health care provider every 1-3 years as part of a regular health exam.  If you are 36 or older, have a CBE every year. Also consider having a breast X-ray (mammogram) every year.  If you have a family history of breast cancer, talk to your health care provider about genetic screening.  If you are at high risk for breast cancer, talk to your health care provider about having an MRI and a mammogram every year.  Breast cancer gene (BRCA) assessment is recommended for women who have family members with BRCA-related cancers.  BRCA-related cancers include:  Breast.  Ovarian.  Tubal.  Peritoneal cancers.  Results of the assessment will determine the need for genetic counseling and BRCA1 and BRCA2 testing. Cervical Cancer Routine pelvic examinations to screen for cervical cancer are no longer recommended for nonpregnant women who are considered low risk for cancer of the pelvic organs (ovaries, uterus, and vagina) and who do not have symptoms. A pelvic examination may be necessary if you have symptoms including those associated with pelvic infections. Ask your health care provider if a screening pelvic exam is right for you.   The Pap test is the screening test for cervical cancer for women who are considered at risk.  If you had a hysterectomy for a problem that was not cancer or a condition that could lead to cancer, then you no longer need Pap tests.  If you are older than 65 years, and you have had normal Pap tests for the past 10 years, you no longer need to have Pap tests.  If you have had past treatment for cervical cancer or a condition that could lead to cancer, you need Pap tests and screening for cancer for at least 20 years after your treatment.  If you no longer get a Pap test, assess your risk factors if they change (such as having a new sexual partner). This can affect whether you should start being screened again.  Some women have medical problems that increase their chance of getting cervical cancer. If this is the case for you, your health care provider may recommend more frequent screening and Pap tests.  The human papillomavirus (HPV) test is another test that may be used for cervical cancer screening. The HPV test looks for the virus that can cause cell changes in the cervix. The cells collected during the Pap test can be tested for HPV.  The HPV test can be used to screen women 8 years of age and older. Getting tested for HPV can extend the interval between normal Pap tests from three to  five years.  An HPV test also should be used to screen women of any age who have unclear Pap test results.  After 60 years of age, women should have HPV testing as often as Pap tests.  Colorectal Cancer  This type of cancer can be detected and often prevented.  Routine colorectal cancer screening usually begins at 60 years of age and continues through 60 years of age.  Your health care provider may recommend screening at an earlier age if you have risk factors for colon cancer.  Your health care provider may also recommend using home test kits to check for hidden blood in the stool.  A small camera at the end of a tube can be used to examine your colon directly (sigmoidoscopy or colonoscopy). This is done to check for the earliest forms of colorectal cancer.  Routine screening usually begins at age 26.  Direct examination of the colon should be repeated every 5-10 years through 60 years of age. However, you may need to be screened more often if early forms of precancerous polyps or small growths are found. Skin Cancer  Check your skin from head to toe regularly.  Tell your health care provider about any new moles or changes in moles, especially if there is a change in a mole's shape or color.  Also tell your health care provider if you have a mole that is larger than the size of a pencil eraser.  Always use sunscreen. Apply sunscreen liberally and repeatedly throughout the day.  Protect yourself by wearing long sleeves, pants, a wide-brimmed hat, and sunglasses whenever you are outside. HEART DISEASE, DIABETES, AND HIGH BLOOD PRESSURE   Have your blood pressure checked at least every 1-2 years. High blood pressure causes heart disease and increases the risk of stroke.  If you are between 76 years and 55 years old, ask your health care provider if you should take aspirin to prevent strokes.  Have regular diabetes screenings. This involves taking a blood sample to check your  fasting blood sugar level.  If you are at a normal weight and have a low risk for diabetes, have this test once every three years after 60 years of age.  If you are overweight and have a high risk for diabetes, consider being tested at a younger age or more often. PREVENTING INFECTION  Hepatitis B  If you have a higher risk for hepatitis B, you should be screened for this virus. You are considered at high risk for hepatitis B if:  You were born in a country where hepatitis B is common. Ask your health care provider which countries are considered high risk.  Your parents were born in a high-risk country, and you have not been immunized against hepatitis B (hepatitis B vaccine).  You have HIV or AIDS.  You use needles to inject street drugs.  You live with someone who has hepatitis B.  You have had sex with someone who has hepatitis B.  You get hemodialysis treatment.  You take certain medicines for conditions, including cancer, organ transplantation, and autoimmune conditions. Hepatitis C  Blood testing is recommended for:  Everyone born from [birth year] through 1966.  Anyone with known risk factors for hepatitis C. Sexually transmitted infections (STIs)  You should be screened for sexually transmitted infections (STIs) including gonorrhea and chlamydia if:  You are sexually active and are younger than 60 years of age.  You are older than 60 years of age and your health care provider tells you that you are at risk for this type of infection.  Your sexual activity has changed since you were last screened and you are at an increased risk for chlamydia or gonorrhea. Ask your health care provider if you are at risk.  If you do not have HIV, but are at risk, it may be recommended that you take a prescription medicine daily to prevent HIV infection. This is called pre-exposure prophylaxis (PrEP). You are considered at risk if:  You are sexually active and do not regularly use condoms or  know the HIV status of your partner(s).  You take drugs by injection.  You are sexually active with a partner who has HIV. Talk with your health care provider about whether you are at high risk of being infected with HIV. If you choose to begin PrEP, you should first be tested for HIV. You should then be  tested every 3 months for as long as you are taking PrEP.  PREGNANCY   If you are premenopausal and you may become pregnant, ask your health care provider about preconception counseling.  If you may become pregnant, take 400 to 800 micrograms (mcg) of folic acid every day.  If you want to prevent pregnancy, talk to your health care provider about birth control (contraception). OSTEOPOROSIS AND MENOPAUSE   Osteoporosis is a disease in which the bones lose minerals and strength with aging. This can result in serious bone fractures. Your risk for osteoporosis can be identified using a bone density scan.  If you are 25 years of age or older, or if you are at risk for osteoporosis and fractures, ask your health care provider if you should be screened.  Ask your health care provider whether you should take a calcium or vitamin D supplement to lower your risk for osteoporosis.  Menopause may have certain physical symptoms and risks.  Hormone replacement therapy may reduce some of these symptoms and risks. Talk to your health care provider about whether hormone replacement therapy is right for you.  HOME CARE INSTRUCTIONS   Schedule regular health, dental, and eye exams.  Stay current with your immunizations.   Do not use any tobacco products including cigarettes, chewing tobacco, or electronic cigarettes.  If you are pregnant, do not drink alcohol.  If you are breastfeeding, limit how much and how often you drink alcohol.  Limit alcohol intake to no more than 1 drink per day for nonpregnant women. One drink equals 12 ounces of beer, 5 ounces of wine, or 1 ounces of hard liquor.  Do  not use street drugs.  Do not share needles.  Ask your health care provider for help if you need support or information about quitting drugs.  Tell your health care provider if you often feel depressed.  Tell your health care provider if you have ever been abused or do not feel safe at home. Document Released: 01/07/2011 Document Revised: 11/08/2013 Document Reviewed: 05/26/2013 [facility] Patient Information 2015 [place], [place]. This information is not intended to replace advice given to you by your health care provider. Make sure you discuss any questions you have with your health care provider.

## 2015-05-04 NOTE — Progress Notes (Signed)
Nancy Burton Mar 24, 1956 [id number]        59 y.o.  [id number] for annual exam.  Doing well. Several issues noted below.  Past medical history,surgical history, problem list, medications, allergies, family history and social history were all reviewed and documented as reviewed in the EPIC chart.  ROS:  Performed with pertinent positives and negatives included in the history, assessment and plan.   Additional significant findings :  none   Exam: Kim Counsellor Vitals:   05/04/15 1118  BP: 140/84  Height: 5\' 4"  (1.626 m)  Weight: 147 lb (66.679 kg)   General appearance:  Normal affect, orientation and appearance. Skin: Grossly normal HEENT: Without gross lesions.  No cervical or supraclavicular adenopathy. Thyroid normal.  Lungs:  Clear without wheezing, rales or rhonchi Cardiac: RR, without RMG Abdominal:  Soft, nontender, without masses, guarding, rebound, organomegaly or hernia Breasts:  Examined lying and sitting without masses, retractions, discharge or axillary adenopathy. Pelvic:  Ext/BUS/vagina with atrophic changes. Pap smear of cuff done  Adnexa  Without masses or tenderness    Anus and perineum  Normal   Rectovaginal  Normal sphincter tone without palpated masses or tenderness.    Assessment/Plan:  59 y.o. [id number] female for annual exam.   1. Postmenopausal/atrophic genital changes. 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 Doing well without significant hot flushes, night sweats, vaginal dryness. 2. History of AIS. Pap smear of vaginal cuff done today. Had cone biopsy which showed AIS 1983. Subsequent TVH with negative cervix at that time. Will continue with annual cytology for now. 3. Osteopenia. DEXA 06/2013 T score -1.4 FRAX 6%/0.4%. Plan on repeat DEXA after the first of the year. Patient will schedule. Check vitamin D level today. 4. Mammography 04/2015. Continue with annual mammography. SBE monthly reviewed. 5. Colonoscopy 2013.  Repeat at their recommended interval. 6. Health maintenance. Patient does have a primary physician but requested baseline labs. CBC comprehensive metabolic panel lipid profile urinalysis TSH vitamin D ordered. Blood pressure 140/84 noted to the patient. She is being followed for hypertension.  Follow up in one year, sooner as needed.   Anastasio Auerbach MD, 11:44 AM 05/04/2015

## 2015-05-05 LAB — URINALYSIS W MICROSCOPIC + REFLEX CULTURE
BILIRUBIN URINE: NEGATIVE
CRYSTALS: NONE SEEN [HPF]
Casts: NONE SEEN [LPF]
GLUCOSE, UA: NEGATIVE
Hgb urine dipstick: NEGATIVE
KETONES UR: NEGATIVE
Nitrite: NEGATIVE
PROTEIN: NEGATIVE
RBC / HPF: NONE SEEN RBC/HPF (ref <=2)
SPECIFIC GRAVITY, URINE: 1.006 (ref 1.001–1.035)
Squamous Epithelial / LPF: NONE SEEN [HPF] (ref <=5)
Yeast: NONE SEEN [HPF]
pH: 6.5 (ref 5.0–8.0)

## 2015-05-05 LAB — VITAMIN D 25 HYDROXY (VIT D DEFICIENCY, FRACTURES): Vit D, 25-Hydroxy: 35 ng/mL (ref 30–100)

## 2015-05-08 ENCOUNTER — Other Ambulatory Visit: Payer: Self-pay | Admitting: *Deleted

## 2015-05-08 LAB — CYTOLOGY - PAP

## 2015-05-08 MED ORDER — SULFAMETHOXAZOLE-TRIMETHOPRIM 800-160 MG PO TABS: 1.0000 | ORAL_TABLET | Freq: Two times a day (BID) | ORAL | Status: DC

## 2015-05-09 LAB — URINE CULTURE

## 2015-07-09 DIAGNOSIS — M8580 Other specified disorders of bone density and structure, unspecified site: Secondary | ICD-10-CM

## 2015-07-09 HISTORY — DX: Other specified disorders of bone density and structure, unspecified site: M85.80

## 2015-07-18 ENCOUNTER — Encounter: Payer: Self-pay | Admitting: Gynecology

## 2015-07-18 ENCOUNTER — Other Ambulatory Visit: Payer: Self-pay | Admitting: Gynecology

## 2015-07-18 ENCOUNTER — Telehealth: Payer: Self-pay | Admitting: Gynecology

## 2015-07-18 ENCOUNTER — Ambulatory Visit (INDEPENDENT_AMBULATORY_CARE_PROVIDER_SITE_OTHER): Payer: BLUE CROSS/BLUE SHIELD

## 2015-07-18 DIAGNOSIS — M899 Disorder of bone, unspecified: Secondary | ICD-10-CM

## 2015-07-18 DIAGNOSIS — M8589 Other specified disorders of bone density and structure, multiple sites: Secondary | ICD-10-CM

## 2015-07-18 DIAGNOSIS — M8580 Other specified disorders of bone density and structure, unspecified site: Secondary | ICD-10-CM

## 2015-07-18 DIAGNOSIS — Z13820 Encounter for screening for osteoporosis: Secondary | ICD-10-CM

## 2015-07-18 NOTE — Telephone Encounter (Signed)
Left message for pt to call.

## 2015-07-18 NOTE — Telephone Encounter (Signed)
Pt informed with the below note. 

## 2015-07-18 NOTE — Telephone Encounter (Signed)
Tell patient her bone density shows a little bit of loss. Not to the point that I would want to treat her but I would recommend supplementing with vitamin D 1000 units OTC daily. Plan on repeating her bone density in 2 years

## 2016-06-03 ENCOUNTER — Encounter: Payer: Self-pay | Admitting: Gynecology

## 2016-06-03 ENCOUNTER — Ambulatory Visit (INDEPENDENT_AMBULATORY_CARE_PROVIDER_SITE_OTHER): Payer: BLUE CROSS/BLUE SHIELD | Admitting: Gynecology

## 2016-06-03 ENCOUNTER — Other Ambulatory Visit: Payer: Self-pay | Admitting: Gynecology

## 2016-06-03 VITALS — BP 120/78 | Ht 64.0 in | Wt 144.0 lb

## 2016-06-03 DIAGNOSIS — Z1329 Encounter for screening for other suspected endocrine disorder: Secondary | ICD-10-CM | POA: Diagnosis not present

## 2016-06-03 DIAGNOSIS — Z01419 Encounter for gynecological examination (general) (routine) without abnormal findings: Secondary | ICD-10-CM

## 2016-06-03 DIAGNOSIS — Z13220 Encounter for screening for lipoid disorders: Secondary | ICD-10-CM

## 2016-06-03 DIAGNOSIS — Z1321 Encounter for screening for nutritional disorder: Secondary | ICD-10-CM

## 2016-06-03 DIAGNOSIS — Z1231 Encounter for screening mammogram for malignant neoplasm of breast: Secondary | ICD-10-CM

## 2016-06-03 NOTE — Progress Notes (Signed)
    Nancy Burton 08/30/1955 [id number]        60 y.o.  [id number]  for annual exam.    Past medical history,surgical history, problem list, medications, allergies, family history and social history were all reviewed and documented as reviewed in the EPIC chart.  ROS:  Performed with pertinent positives and negatives included in the history, assessment and plan.   Additional significant findings :  None   Exam: [name] assistant Vitals:   06/03/16 0758  BP: 120/78  Weight: 144 lb (65.3 kg)  Height: 5\' 4"  (1.626 m)   Body mass index is 24.72 kg/m.  General appearance:  Normal affect, orientation and appearance. Skin: Grossly normal HEENT: Without gross lesions.  No cervical or supraclavicular adenopathy. Thyroid normal.  Lungs:  Clear without wheezing, rales or rhonchi Cardiac: RR, without RMG Abdominal:  Soft, nontender, without masses, guarding, rebound, organomegaly or hernia Breasts:  Examined lying and sitting without masses, retractions, discharge or axillary adenopathy. Pelvic:  Ext, BUS, Vagina with atrophic changes. Pap smear of cuff done  Adnexa without masses or tenderness    Anus and perineum normal   Rectovaginal normal sphincter tone without palpated masses or tenderness.    Assessment/Plan:  60 y.o. [id number] female for annual exam.   1. Postmenopausal. Doing well without significant hot flushes, night sweats, vaginal dryness. 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 2. History of adenocarcinoma of the endocervix. Pap smear of cuff done today. 3. Osteopenia. DEXA 07/2015 T score -1.8 FRAX 7.6%/0.7%. Check vitamin D level today. 4. Mammography due now and I reminded patient to schedule and she agrees to do so. SBE monthly reviewed. 5. Colonoscopy 2013. Repeat at their recommended interval. 6. Health maintenance. Baseline CBC, CMP, lipid profile, TSH, vitamin D, urinalysis ordered. Follow up 1 year, sooner as  needed.   Anastasio Auerbach MD, 8:23 AM 06/03/2016

## 2016-06-03 NOTE — Addendum Note (Signed)
Addended by: Nelva Nay on: 06/03/2016 09:29 AM   Modules accepted: Orders

## 2016-06-03 NOTE — Patient Instructions (Addendum)
Call to Schedule your mammogram  Facilities in [place]: 1)  [facility]. [address] Phone: [phone]     Mammogram A mammogram is an X-ray test to find changes in a woman's breast. You should get a mammogram if:  You are 61 years of age or older  You have risk factors.   Your doctor recommends that you have one.  BEFORE THE TEST  Do not schedule the test the week before your period, especially if your breasts are sore during this time.  On the day of your mammogram:  Wash your breasts and armpits well. After washing, do not put on any deodorant or talcum powder on until after your test.   Eat and drink as you usually do.   Take your medicines as usual.   If you are diabetic and take insulin, make sure you:   Eat before coming for your test.   Take your insulin as usual.   If you cannot keep your appointment, call before the appointment to cancel. Schedule another appointment.  TEST  You will need to undress from the waist up. You will put on a hospital gown.   Your breast will be put on the mammogram machine, and it will press firmly on your breast with a piece of plastic called a compression paddle. This will make your breast flatter so that the machine can X-ray all parts of your breast.   Both breasts will be X-rayed. Each breast will be X-rayed from above and from the side. An X-ray might need to be taken again if the picture is not good enough.   The mammogram will last about 15 to 30 minutes.  AFTER THE TEST Finding out the results of your test Ask when your test results will be ready. Make sure you get your test results.  Document Released: 09/20/2008 Document Revised: 06/13/2011 Document Reviewed: 09/20/2008 [facility] Patient Information 2012 [place].  You may obtain a copy of any labs that were done today by logging onto MyChart as outlined in the instructions provided with  your AVS (after visit summary). The office will not call with normal lab results but certainly if there are any significant abnormalities then we will contact you.   Health Maintenance Adopting a healthy lifestyle and getting preventive care can go a long way to promote health and wellness. Talk with your health care provider about what schedule of regular examinations is right for you. This is a good chance for you to check in with your provider about disease prevention and staying healthy. In between checkups, there are plenty of things you can do on your own. Experts have done a lot of research about which lifestyle changes and preventive measures are most likely to keep you healthy. Ask your health care provider for more information. WEIGHT AND DIET  Eat a healthy diet  Be sure to include plenty of vegetables, fruits, low-fat dairy products, and lean protein.  Do not eat a lot of foods high in solid fats, added sugars, or salt.  Get regular exercise. This is one of the most important things you can do for your health.  Most adults should exercise for at least 150 minutes each week. The exercise should increase your heart rate and make you sweat (moderate-intensity exercise).  Most adults should also do strengthening exercises at least twice a week. This is in addition to the moderate-intensity exercise.  Maintain a healthy weight  Body mass index (BMI) is a measurement that can be used to identify possible weight problems. It estimates body fat based on height and weight. Your health care provider can help determine your BMI and help you achieve or maintain a healthy weight.  For females 35 years of age and older:   A BMI below 18.5 is considered underweight.  A BMI of 18.5 to 24.9 is normal.  A BMI of 25 to 29.9 is considered overweight.  A BMI of 30 and above is considered obese.  Watch levels of cholesterol and blood lipids  You should start having your blood tested for  lipids and cholesterol at 61 years of age, then have this test every 5 years.  You may need to have your cholesterol levels checked more often if:  Your lipid or cholesterol levels are high.  You are older than 61 years of age.  You are at high risk for heart disease.  CANCER SCREENING   Lung Cancer  Lung cancer screening is recommended for adults 52-65 years old who are at high risk for lung cancer because of a history of smoking.  A yearly low-dose CT scan of the lungs is recommended for people who:  Currently smoke.  Have quit within the past 15 years.  Have at least a 30-pack-year history of smoking. A pack year is smoking an average of one pack of cigarettes a day for 1 year.  Yearly screening should continue until it has been 15 years since you quit.  Yearly screening should stop if you develop a health problem that would prevent you from having lung cancer treatment.  Breast Cancer  Practice breast self-awareness. This means understanding how your breasts normally appear and feel.  It also means doing regular breast self-exams. Let your health care provider know about any changes, no matter how small.  If you are in your 20s or 30s, you should have a clinical breast exam (CBE) by a health care provider every 1-3 years as part of a regular health exam.  If you are 61 or older, have a CBE every year. Also consider having a breast X-ray (mammogram) every year.  If you have a family history of breast cancer, talk to your health care provider about genetic screening.  If you are at high risk for breast cancer, talk to your health care provider about having an MRI and a mammogram every year.  Breast cancer gene (BRCA) assessment is recommended for women who have family members with BRCA-related cancers. BRCA-related cancers include:  Breast.  Ovarian.  Tubal.  Peritoneal cancers.  Results of the assessment will determine the need for genetic counseling and BRCA1  and BRCA2 testing. Cervical Cancer Routine pelvic examinations to screen for cervical cancer are no longer recommended for nonpregnant women who are considered low risk for cancer of the pelvic organs (ovaries, uterus, and vagina) and who do not have symptoms. A pelvic examination may be necessary if you have symptoms including those associated with pelvic infections. Ask your health care provider if a screening pelvic exam is right for you.   The Pap test is the screening test for cervical cancer for women who are considered at risk.  If you had a hysterectomy for a problem that was not cancer or a condition that could lead to cancer, then you no longer need Pap tests.  If you are older than 65 years, and you have had normal Pap tests for the past 10 years, you no longer need  to have Pap tests.  If you have had past treatment for cervical cancer or a condition that could lead to cancer, you need Pap tests and screening for cancer for at least 20 years after your treatment.  If you no longer get a Pap test, assess your risk factors if they change (such as having a new sexual partner). This can affect whether you should start being screened again.  Some women have medical problems that increase their chance of getting cervical cancer. If this is the case for you, your health care provider may recommend more frequent screening and Pap tests.  The human papillomavirus (HPV) test is another test that may be used for cervical cancer screening. The HPV test looks for the virus that can cause cell changes in the cervix. The cells collected during the Pap test can be tested for HPV.  The HPV test can be used to screen women 80 years of age and older. Getting tested for HPV can extend the interval between normal Pap tests from three to five years.  An HPV test also should be used to screen women of any age who have unclear Pap test results.  After 61 years of age, women should have HPV testing as often  as Pap tests.  Colorectal Cancer  This type of cancer can be detected and often prevented.  Routine colorectal cancer screening usually begins at 61 years of age and continues through 61 years of age.  Your health care provider may recommend screening at an earlier age if you have risk factors for colon cancer.  Your health care provider may also recommend using home test kits to check for hidden blood in the stool.  A small camera at the end of a tube can be used to examine your colon directly (sigmoidoscopy or colonoscopy). This is done to check for the earliest forms of colorectal cancer.  Routine screening usually begins at age 55.  Direct examination of the colon should be repeated every 5-10 years through 61 years of age. However, you may need to be screened more often if early forms of precancerous polyps or small growths are found. Skin Cancer  Check your skin from head to toe regularly.  Tell your health care provider about any new moles or changes in moles, especially if there is a change in a mole's shape or color.  Also tell your health care provider if you have a mole that is larger than the size of a pencil eraser.  Always use sunscreen. Apply sunscreen liberally and repeatedly throughout the day.  Protect yourself by wearing long sleeves, pants, a wide-brimmed hat, and sunglasses whenever you are outside. HEART DISEASE, DIABETES, AND HIGH BLOOD PRESSURE   Have your blood pressure checked at least every 1-2 years. High blood pressure causes heart disease and increases the risk of stroke.  If you are between 91 years and 17 years old, ask your health care provider if you should take aspirin to prevent strokes.  Have regular diabetes screenings. This involves taking a blood sample to check your fasting blood sugar level.  If you are at a normal weight and have a low risk for diabetes, have this test once every three years after 61 years of age.  If you are overweight  and have a high risk for diabetes, consider being tested at a younger age or more often. PREVENTING INFECTION  Hepatitis B  If you have a higher risk for hepatitis B, you should be screened for  this virus. You are considered at high risk for hepatitis B if:  You were born in a country where hepatitis B is common. Ask your health care provider which countries are considered high risk.  Your parents were born in a high-risk country, and you have not been immunized against hepatitis B (hepatitis B vaccine).  You have HIV or AIDS.  You use needles to inject street drugs.  You live with someone who has hepatitis B.  You have had sex with someone who has hepatitis B.  You get hemodialysis treatment.  You take certain medicines for conditions, including cancer, organ transplantation, and autoimmune conditions. Hepatitis C  Blood testing is recommended for:  Everyone born from [birth year] through 1966.  Anyone with known risk factors for hepatitis C. Sexually transmitted infections (STIs)  You should be screened for sexually transmitted infections (STIs) including gonorrhea and chlamydia if:  You are sexually active and are younger than 61 years of age.  You are older than 61 years of age and your health care provider tells you that you are at risk for this type of infection.  Your sexual activity has changed since you were last screened and you are at an increased risk for chlamydia or gonorrhea. Ask your health care provider if you are at risk.  If you do not have HIV, but are at risk, it may be recommended that you take a prescription medicine daily to prevent HIV infection. This is called pre-exposure prophylaxis (PrEP). You are considered at risk if:  You are sexually active and do not regularly use condoms or know the HIV status of your partner(s).  You take drugs by injection.  You are sexually active with a partner who has HIV. Talk with your health care provider about whether  you are at high risk of being infected with HIV. If you choose to begin PrEP, you should first be tested for HIV. You should then be tested every 3 months for as long as you are taking PrEP.  PREGNANCY   If you are premenopausal and you may become pregnant, ask your health care provider about preconception counseling.  If you may become pregnant, take 400 to 800 micrograms (mcg) of folic acid every day.  If you want to prevent pregnancy, talk to your health care provider about birth control (contraception). OSTEOPOROSIS AND MENOPAUSE   Osteoporosis is a disease in which the bones lose minerals and strength with aging. This can result in serious bone fractures. Your risk for osteoporosis can be identified using a bone density scan.  If you are 68 years of age or older, or if you are at risk for osteoporosis and fractures, ask your health care provider if you should be screened.  Ask your health care provider whether you should take a calcium or vitamin D supplement to lower your risk for osteoporosis.  Menopause may have certain physical symptoms and risks.  Hormone replacement therapy may reduce some of these symptoms and risks. Talk to your health care provider about whether hormone replacement therapy is right for you.  HOME CARE INSTRUCTIONS   Schedule regular health, dental, and eye exams.  Stay current with your immunizations.   Do not use any tobacco products including cigarettes, chewing tobacco, or electronic cigarettes.  If you are pregnant, do not drink alcohol.  If you are breastfeeding, limit how much and how often you drink alcohol.  Limit alcohol intake to no more than 1 drink per day for nonpregnant women. One drink  equals 12 ounces of beer, 5 ounces of wine, or 1 ounces of hard liquor.  Do not use street drugs.  Do not share needles.  Ask your health care provider for help if you need support or information about quitting drugs.  Tell your health care  provider if you often feel depressed.  Tell your health care provider if you have ever been abused or do not feel safe at home. Document Released: 01/07/2011 Document Revised: 11/08/2013 Document Reviewed: 05/26/2013 [facility] Patient Information 2015 [place], [place]. This information is not intended to replace advice given to you by your health care provider. Make sure you discuss any questions you have with your health care provider.

## 2016-06-04 LAB — CBC WITH DIFFERENTIAL/PLATELET
BASOS ABS: 0 {cells}/uL (ref 0–200)
Basophils Relative: 0 %
EOS PCT: 1 %
Eosinophils Absolute: 56 cells/uL (ref 15–500)
HCT: 39.3 % (ref 35.0–45.0)
HEMOGLOBIN: 13 g/dL (ref 11.7–15.5)
LYMPHS PCT: 23 %
Lymphs Abs: 1288 cells/uL (ref 850–3900)
MCH: 31 pg (ref 27.0–33.0)
MCHC: 33.1 g/dL (ref 32.0–36.0)
MCV: 93.8 fL (ref 80.0–100.0)
MONOS PCT: 7 %
MPV: 8.5 fL (ref 7.5–12.5)
Monocytes Absolute: 392 cells/uL (ref 200–950)
NEUTROS PCT: 69 %
Neutro Abs: 3864 cells/uL (ref 1500–7800)
Platelets: 309 10*3/uL (ref 140–400)
RBC: 4.19 MIL/uL (ref 3.80–5.10)
RDW: 13.2 % (ref 11.0–15.0)
WBC: 5.6 10*3/uL (ref 3.8–10.8)

## 2016-06-04 LAB — URINALYSIS W MICROSCOPIC + REFLEX CULTURE
BACTERIA UA: NONE SEEN [HPF]
Bilirubin Urine: NEGATIVE
CASTS: NONE SEEN [LPF]
Crystals: NONE SEEN [HPF]
Glucose, UA: NEGATIVE
HGB URINE DIPSTICK: NEGATIVE
Ketones, ur: NEGATIVE
Leukocytes, UA: NEGATIVE
NITRITE: NEGATIVE
Protein, ur: NEGATIVE
RBC / HPF: NONE SEEN RBC/HPF (ref <=2)
SQUAMOUS EPITHELIAL / LPF: NONE SEEN [HPF] (ref <=5)
Specific Gravity, Urine: 1.009 (ref 1.001–1.035)
WBC, UA: NONE SEEN WBC/HPF (ref <=5)
YEAST: NONE SEEN [HPF]
pH: 6 (ref 5.0–8.0)

## 2016-06-04 LAB — COMPREHENSIVE METABOLIC PANEL
ALBUMIN: 4.3 g/dL (ref 3.6–5.1)
ALT: 20 U/L (ref 6–29)
AST: 25 U/L (ref 10–35)
Alkaline Phosphatase: 66 U/L (ref 33–130)
BUN: 15 mg/dL (ref 7–25)
CHLORIDE: 103 mmol/L (ref 98–110)
CO2: 26 mmol/L (ref 20–31)
CREATININE: 0.85 mg/dL (ref 0.50–0.99)
Calcium: 8.9 mg/dL (ref 8.6–10.4)
GLUCOSE: 103 mg/dL — AB (ref 65–99)
Potassium: 4.1 mmol/L (ref 3.5–5.3)
SODIUM: 137 mmol/L (ref 135–146)
Total Bilirubin: 0.8 mg/dL (ref 0.2–1.2)
Total Protein: 6.9 g/dL (ref 6.1–8.1)

## 2016-06-04 LAB — LIPID PANEL
Cholesterol: 204 mg/dL — ABNORMAL HIGH (ref <200)
HDL: 75 mg/dL (ref >50)
LDL CALC: 111 mg/dL — AB (ref <100)
TRIGLYCERIDES: 92 mg/dL (ref <150)
Total CHOL/HDL Ratio: 2.7 Ratio (ref <5.0)
VLDL: 18 mg/dL (ref <30)

## 2016-06-04 LAB — TSH: TSH: 1.82 mIU/L

## 2016-06-04 LAB — VITAMIN D 25 HYDROXY (VIT D DEFICIENCY, FRACTURES): Vit D, 25-Hydroxy: 43 ng/mL (ref 30–100)

## 2016-06-05 LAB — PAP IG W/ RFLX HPV ASCU

## 2016-07-10 ENCOUNTER — Ambulatory Visit
Admission: RE | Admit: 2016-07-10 | Discharge: 2016-07-10 | Disposition: A | Discharge status: Home / Self Care | Payer: BLUE CROSS/BLUE SHIELD | Source: Ambulatory Visit | Attending: Gynecology | Admitting: Gynecology

## 2016-07-10 DIAGNOSIS — Z1231 Encounter for screening mammogram for malignant neoplasm of breast: Secondary | ICD-10-CM

## 2017-05-28 ENCOUNTER — Other Ambulatory Visit: Payer: Self-pay | Admitting: Gynecology

## 2017-05-28 DIAGNOSIS — Z1231 Encounter for screening mammogram for malignant neoplasm of breast: Secondary | ICD-10-CM

## 2017-07-15 ENCOUNTER — Encounter: Payer: Self-pay | Admitting: Gynecology

## 2017-07-15 ENCOUNTER — Ambulatory Visit: Payer: BLUE CROSS/BLUE SHIELD

## 2017-07-15 ENCOUNTER — Ambulatory Visit
Admission: RE | Admit: 2017-07-15 | Discharge: 2017-07-15 | Disposition: A | Discharge status: Home / Self Care | Payer: BLUE CROSS/BLUE SHIELD | Source: Ambulatory Visit | Attending: Gynecology | Admitting: Gynecology

## 2017-07-15 ENCOUNTER — Ambulatory Visit (INDEPENDENT_AMBULATORY_CARE_PROVIDER_SITE_OTHER): Payer: BLUE CROSS/BLUE SHIELD | Admitting: Gynecology

## 2017-07-15 VITALS — BP 124/80 | Ht 64.0 in | Wt 141.0 lb

## 2017-07-15 DIAGNOSIS — M8580 Other specified disorders of bone density and structure, unspecified site: Secondary | ICD-10-CM

## 2017-07-15 DIAGNOSIS — Z01411 Encounter for gynecological examination (general) (routine) with abnormal findings: Secondary | ICD-10-CM | POA: Diagnosis not present

## 2017-07-15 DIAGNOSIS — Z13220 Encounter for screening for lipoid disorders: Secondary | ICD-10-CM | POA: Diagnosis not present

## 2017-07-15 DIAGNOSIS — Z1231 Encounter for screening mammogram for malignant neoplasm of breast: Secondary | ICD-10-CM

## 2017-07-15 DIAGNOSIS — N952 Postmenopausal atrophic vaginitis: Secondary | ICD-10-CM

## 2017-07-15 NOTE — Progress Notes (Signed)
    Nancy Burton [birth year] [id number]        61 y.o.  [id number] for annual gynecologic exam.  Doing well without gynecologic complaints  Past medical history,surgical history, problem list, medications, allergies, family history and social history were all reviewed and documented as reviewed in the EPIC chart.  ROS:  Performed with pertinent positives and negatives included in the history, assessment and plan.   Additional significant findings : None   Exam: [name] assistant Vitals:   07/15/17 0920  BP: 124/80  Weight: 141 lb (64 kg)  Height: 5\' 4"  (1.626 m)   Body mass index is 24.2 kg/m.  General appearance:  Normal affect, orientation and appearance. Skin: Grossly normal HEENT: Without gross lesions.  No cervical or supraclavicular adenopathy. Thyroid normal.  Lungs:  Clear without wheezing, rales or rhonchi Cardiac: RR, without RMG Abdominal:  Soft, nontender, without masses, guarding, rebound, organomegaly or hernia Breasts:  Examined lying and sitting without masses, retractions, discharge or axillary adenopathy. Pelvic:  Ext, BUS, Vagina: With atrophic changes.  Pap smear of vaginal cuff done  Adnexa: Without masses or tenderness    Anus and perineum: Normal   Rectovaginal: Normal sphincter tone without palpated masses or tenderness.    Assessment/Plan:  62 y.o. [id number] female for annual gynecologic exam.   1. Postmenopausal/atrophic genital changes.  No significant hot flushes, night sweats or vaginal dryness. 2. History of adenocarcinoma in situ of the endocervix.  Status post [facility] 1989. Pap smear of vaginal cuff done today.  Pap smears have been normal since.  We will continue with annual cytology. 3. Mammography today.  Breast exam normal today.  SBE monthly reviewed. 4. Colonoscopy 2013.  Repeat at their recommended interval. 5. Osteopenia.  DEXA 2017 T score -1.8 FRAX 7.6% / 0.7%.  Check vitamin D level today.  Schedule follow-up DEXA now a 2-year  interval. 6. Health maintenance.  Patient requests baseline labs.  CBC, CMP, lipid profile, TSH, vitamin D ordered.  Follow-up for bone density otherwise 1 year, sooner as needed.   Anastasio Auerbach MD, 9:40 AM 07/15/2017

## 2017-07-15 NOTE — Addendum Note (Signed)
Addended by: Nelva Nay on: 07/15/2017 10:23 AM   Modules accepted: Orders

## 2017-07-15 NOTE — Patient Instructions (Signed)
Follow-up for the bone density as scheduled.  Follow-up for annual exam in 1 year. 

## 2017-07-16 LAB — CBC WITH DIFFERENTIAL/PLATELET
BASOS ABS: 18 {cells}/uL (ref 0–200)
Basophils Relative: 0.3 %
EOS PCT: 1.2 %
Eosinophils Absolute: 72 cells/uL (ref 15–500)
HEMATOCRIT: 39.1 % (ref 35.0–45.0)
HEMOGLOBIN: 13.2 g/dL (ref 11.7–15.5)
LYMPHS ABS: 1206 {cells}/uL (ref 850–3900)
MCH: 31 pg (ref 27.0–33.0)
MCHC: 33.8 g/dL (ref 32.0–36.0)
MCV: 91.8 fL (ref 80.0–100.0)
MPV: 8.9 fL (ref 7.5–12.5)
Monocytes Relative: 7.5 %
NEUTROS ABS: 4254 {cells}/uL (ref 1500–7800)
Neutrophils Relative %: 70.9 %
Platelets: 317 10*3/uL (ref 140–400)
RBC: 4.26 10*6/uL (ref 3.80–5.10)
RDW: 12.3 % (ref 11.0–15.0)
Total Lymphocyte: 20.1 %
WBC: 6 10*3/uL (ref 3.8–10.8)
WBCMIX: 450 {cells}/uL (ref 200–950)

## 2017-07-16 LAB — LIPID PANEL
Cholesterol: 199 mg/dL (ref <200)
HDL: 69 mg/dL (ref >50)
LDL Cholesterol (Calc): 111 mg/dL (calc) — ABNORMAL HIGH
Non-HDL Cholesterol (Calc): 130 mg/dL (calc) — ABNORMAL HIGH (ref <130)
Total CHOL/HDL Ratio: 2.9 (calc) (ref <5.0)
Triglycerides: 88 mg/dL (ref <150)

## 2017-07-16 LAB — COMPREHENSIVE METABOLIC PANEL
AG RATIO: 1.7 (calc) (ref 1.0–2.5)
ALBUMIN MSPROF: 4.3 g/dL (ref 3.6–5.1)
ALKALINE PHOSPHATASE (APISO): 86 U/L (ref 33–130)
ALT: 20 U/L (ref 6–29)
AST: 18 U/L (ref 10–35)
BILIRUBIN TOTAL: 0.4 mg/dL (ref 0.2–1.2)
BUN: 15 mg/dL (ref 7–25)
CALCIUM: 9.3 mg/dL (ref 8.6–10.4)
CHLORIDE: 103 mmol/L (ref 98–110)
CO2: 26 mmol/L (ref 20–32)
Creat: 0.8 mg/dL (ref 0.50–0.99)
Globulin: 2.6 g/dL (calc) (ref 1.9–3.7)
Glucose, Bld: 104 mg/dL — ABNORMAL HIGH (ref 65–99)
POTASSIUM: 4.3 mmol/L (ref 3.5–5.3)
SODIUM: 138 mmol/L (ref 135–146)
Total Protein: 6.9 g/dL (ref 6.1–8.1)

## 2017-07-16 LAB — PAP IG W/ RFLX HPV ASCU

## 2017-07-16 LAB — VITAMIN D 25 HYDROXY (VIT D DEFICIENCY, FRACTURES): VIT D 25 HYDROXY: 30 ng/mL (ref 30–100)

## 2017-07-16 LAB — TSH: TSH: 1.23 m[IU]/L (ref 0.40–4.50)

## 2017-07-17 ENCOUNTER — Other Ambulatory Visit: Payer: Self-pay | Admitting: Gynecology

## 2017-07-17 DIAGNOSIS — E559 Vitamin D deficiency, unspecified: Secondary | ICD-10-CM

## 2017-08-05 ENCOUNTER — Encounter: Payer: Self-pay | Admitting: Gynecology

## 2017-08-05 ENCOUNTER — Other Ambulatory Visit: Payer: Self-pay | Admitting: Gynecology

## 2017-08-05 ENCOUNTER — Ambulatory Visit (INDEPENDENT_AMBULATORY_CARE_PROVIDER_SITE_OTHER): Payer: BLUE CROSS/BLUE SHIELD

## 2017-08-05 DIAGNOSIS — M8589 Other specified disorders of bone density and structure, multiple sites: Secondary | ICD-10-CM

## 2017-08-05 DIAGNOSIS — M8580 Other specified disorders of bone density and structure, unspecified site: Secondary | ICD-10-CM

## 2017-08-05 DIAGNOSIS — Z13820 Encounter for screening for osteoporosis: Secondary | ICD-10-CM

## 2017-12-17 ENCOUNTER — Telehealth: Payer: Self-pay | Admitting: *Deleted

## 2017-12-17 NOTE — Telephone Encounter (Signed)
Patient called requesting vitamin d level lab to be drawn at [place] [facility] order faxed to [phone] given by patient.

## 2017-12-18 NOTE — Telephone Encounter (Signed)
Patient aware this has been done.  

## 2018-02-18 ENCOUNTER — Other Ambulatory Visit: Payer: Self-pay | Admitting: Gynecology

## 2018-02-19 LAB — VITAMIN D 25 HYDROXY (VIT D DEFICIENCY, FRACTURES): VIT D 25 HYDROXY: 52 ng/mL (ref 30–100)

## 2018-02-20 ENCOUNTER — Encounter: Payer: Self-pay | Admitting: Gynecology

## 2018-08-11 ENCOUNTER — Other Ambulatory Visit: Payer: Self-pay | Admitting: Gynecology

## 2018-08-11 DIAGNOSIS — Z1231 Encounter for screening mammogram for malignant neoplasm of breast: Secondary | ICD-10-CM

## 2018-08-18 ENCOUNTER — Ambulatory Visit
Admission: RE | Admit: 2018-08-18 | Discharge: 2018-08-18 | Disposition: A | Discharge status: Home / Self Care | Payer: BLUE CROSS/BLUE SHIELD | Source: Ambulatory Visit | Attending: Gynecology | Admitting: Gynecology

## 2018-08-18 DIAGNOSIS — Z1231 Encounter for screening mammogram for malignant neoplasm of breast: Secondary | ICD-10-CM

## 2018-09-22 ENCOUNTER — Encounter: Payer: Self-pay | Admitting: Gynecology

## 2018-09-22 ENCOUNTER — Ambulatory Visit (INDEPENDENT_AMBULATORY_CARE_PROVIDER_SITE_OTHER): Payer: BLUE CROSS/BLUE SHIELD | Admitting: Gynecology

## 2018-09-22 ENCOUNTER — Other Ambulatory Visit: Payer: Self-pay

## 2018-09-22 VITALS — BP 118/76 | Ht 64.0 in | Wt 156.0 lb

## 2018-09-22 DIAGNOSIS — M8580 Other specified disorders of bone density and structure, unspecified site: Secondary | ICD-10-CM | POA: Diagnosis not present

## 2018-09-22 DIAGNOSIS — Z01419 Encounter for gynecological examination (general) (routine) without abnormal findings: Secondary | ICD-10-CM

## 2018-09-22 DIAGNOSIS — Z13220 Encounter for screening for lipoid disorders: Secondary | ICD-10-CM

## 2018-09-22 LAB — COMPREHENSIVE METABOLIC PANEL
AG Ratio: 1.6 (calc) (ref 1.0–2.5)
ALBUMIN MSPROF: 4.4 g/dL (ref 3.6–5.1)
ALT: 18 U/L (ref 6–29)
AST: 22 U/L (ref 10–35)
Alkaline phosphatase (APISO): 71 U/L (ref 37–153)
BUN: 12 mg/dL (ref 7–25)
CHLORIDE: 102 mmol/L (ref 98–110)
CO2: 25 mmol/L (ref 20–32)
CREATININE: 0.81 mg/dL (ref 0.50–0.99)
Calcium: 8.9 mg/dL (ref 8.6–10.4)
GLOBULIN: 2.8 g/dL (ref 1.9–3.7)
GLUCOSE: 98 mg/dL (ref 65–99)
Potassium: 3.7 mmol/L (ref 3.5–5.3)
Sodium: 139 mmol/L (ref 135–146)
Total Bilirubin: 0.6 mg/dL (ref 0.2–1.2)
Total Protein: 7.2 g/dL (ref 6.1–8.1)

## 2018-09-22 LAB — CBC WITH DIFFERENTIAL/PLATELET
Absolute Monocytes: 320 cells/uL (ref 200–950)
BASOS PCT: 0.2 %
Basophils Absolute: 9 cells/uL (ref 0–200)
Eosinophils Absolute: 28 cells/uL (ref 15–500)
Eosinophils Relative: 0.6 %
HCT: 39.7 % (ref 35.0–45.0)
Hemoglobin: 13.7 g/dL (ref 11.7–15.5)
Lymphs Abs: 1556 cells/uL (ref 850–3900)
MCH: 31.2 pg (ref 27.0–33.0)
MCHC: 34.5 g/dL (ref 32.0–36.0)
MCV: 90.4 fL (ref 80.0–100.0)
MONOS PCT: 6.8 %
MPV: 9.3 fL (ref 7.5–12.5)
Neutro Abs: 2787 cells/uL (ref 1500–7800)
Neutrophils Relative %: 59.3 %
PLATELETS: 276 10*3/uL (ref 140–400)
RBC: 4.39 10*6/uL (ref 3.80–5.10)
RDW: 12.9 % (ref 11.0–15.0)
Total Lymphocyte: 33.1 %
WBC: 4.7 10*3/uL (ref 3.8–10.8)

## 2018-09-22 LAB — LIPID PANEL
CHOL/HDL RATIO: 3.3 (calc) (ref <5.0)
Cholesterol: 194 mg/dL (ref <200)
HDL: 59 mg/dL (ref >=50)
LDL Cholesterol (Calc): 117 mg/dL (calc) — ABNORMAL HIGH
Non-HDL Cholesterol (Calc): 135 mg/dL (calc) — ABNORMAL HIGH (ref <130)
Triglycerides: 84 mg/dL (ref <150)

## 2018-09-22 NOTE — Patient Instructions (Signed)
Follow-up in 1 year for annual exam 

## 2018-09-22 NOTE — Progress Notes (Signed)
    YOCHEVED DEPNER Mar 17, 1956 [id number]        63 y.o.  [id number] for annual gynecologic exam.  Without gynecologic complaints  Past medical history,surgical history, problem list, medications, allergies, family history and social history were all reviewed and documented as reviewed in the EPIC chart.  ROS:  Performed with pertinent positives and negatives included in the history, assessment and plan.   Additional significant findings : None   Exam: [name] assistant Vitals:   09/22/18 1129  BP: 118/76  Weight: 156 lb (70.8 kg)  Height: 5\' 4"  (1.626 m)   Body mass index is 26.78 kg/m.  General appearance:  Normal affect, orientation and appearance. Skin: Grossly normal HEENT: Without gross lesions.  No cervical or supraclavicular adenopathy. Thyroid normal.  Lungs:  Clear without wheezing, rales or rhonchi Cardiac: RR, without RMG Abdominal:  Soft, nontender, without masses, guarding, rebound, organomegaly or hernia Breasts:  Examined lying and sitting without masses, retractions, discharge or axillary adenopathy. Pelvic:  Ext, BUS, Vagina: Normal with atrophic changes.  Pap smear done  Adnexa: Without masses or tenderness    Anus and perineum: Normal   Rectovaginal: Normal sphincter tone without palpated masses or tenderness.    Assessment/Plan:  63 y.o. [id number] female for annual gynecologic exam.  Status post [facility]  1. Postmenopausal.  No significant menopausal symptoms. 2. History of adenocarcinoma in situ of the endocervix.  Status post [facility] 1989.  Pap smears normal afterwards.  Pap smear of vaginal cuff done today. 3. Mammography 08/2018.  Continue with annual mammography when due.  Breast exam normal today. 4. Colonoscopy 2013.  Repeat at their recommended interval. 5. Osteopenia.  DEXA 2019 T score -1.7 FRAX 9% / 1%.  Plan repeat DEXA at 2-year interval. 6. Health maintenance.  Baseline CBC, CMP and lipid profile ordered.  TSH and vitamin D normal last year.   Follow-up 1 year, sooner as needed.   Anastasio Auerbach MD, 12:03 PM 09/22/2018

## 2018-09-22 NOTE — Addendum Note (Signed)
Addended by: Nelva Nay on: 09/22/2018 12:28 PM   Modules accepted: Orders

## 2018-09-23 LAB — PAP IG W/ RFLX HPV ASCU

## 2019-04-06 ENCOUNTER — Encounter: Payer: Self-pay | Admitting: Gynecology

## 2019-11-18 ENCOUNTER — Other Ambulatory Visit: Payer: Self-pay | Admitting: Family Medicine

## 2019-11-18 DIAGNOSIS — Z1231 Encounter for screening mammogram for malignant neoplasm of breast: Secondary | ICD-10-CM

## 2019-11-23 ENCOUNTER — Ambulatory Visit
Admission: RE | Admit: 2019-11-23 | Discharge: 2019-11-23 | Disposition: A | Discharge status: Home / Self Care | Payer: Medicare Other | Source: Ambulatory Visit

## 2019-11-23 ENCOUNTER — Other Ambulatory Visit: Payer: Self-pay

## 2019-11-23 DIAGNOSIS — Z1231 Encounter for screening mammogram for malignant neoplasm of breast: Secondary | ICD-10-CM

## 2020-05-26 ENCOUNTER — Other Ambulatory Visit (HOSPITAL_COMMUNITY): Payer: Self-pay | Admitting: Family Medicine

## 2020-05-26 DIAGNOSIS — Z13820 Encounter for screening for osteoporosis: Secondary | ICD-10-CM

## 2020-10-27 ENCOUNTER — Ambulatory Visit (HOSPITAL_COMMUNITY)
Admission: RE | Admit: 2020-10-27 | Discharge: 2020-10-27 | Disposition: A | Discharge status: Home / Self Care | Payer: Medicare Other | Source: Ambulatory Visit | Attending: Family Medicine | Admitting: Family Medicine

## 2020-10-27 ENCOUNTER — Other Ambulatory Visit: Payer: Self-pay

## 2020-10-27 DIAGNOSIS — Z13820 Encounter for screening for osteoporosis: Secondary | ICD-10-CM | POA: Insufficient documentation

## 2020-12-22 ENCOUNTER — Other Ambulatory Visit: Payer: Self-pay | Admitting: Family Medicine

## 2020-12-22 DIAGNOSIS — Z1231 Encounter for screening mammogram for malignant neoplasm of breast: Secondary | ICD-10-CM

## 2021-01-16 ENCOUNTER — Ambulatory Visit
Admission: RE | Admit: 2021-01-16 | Discharge: 2021-01-16 | Disposition: A | Discharge status: Home / Self Care | Payer: Medicare Other | Source: Ambulatory Visit

## 2021-01-16 ENCOUNTER — Other Ambulatory Visit: Payer: Self-pay

## 2021-01-16 DIAGNOSIS — Z1231 Encounter for screening mammogram for malignant neoplasm of breast: Secondary | ICD-10-CM

## 2021-11-28 IMAGING — MG MM DIGITAL SCREENING BILAT W/ TOMO AND CAD
8 series · 9 of 24 positions shown · non-contrast
Comparison: Previous exam(s).

CLINICAL DATA: Screening.

EXAM:
DIGITAL SCREENING BILATERAL MAMMOGRAM WITH TOMOSYNTHESIS AND CAD
TECHNIQUE: Bilateral screening digital craniocaudal and mediolateral oblique
mammograms were obtained. Bilateral screening digital breast
tomosynthesis was performed. The images were evaluated with
computer-aided detection.

[L CC synth-2D]
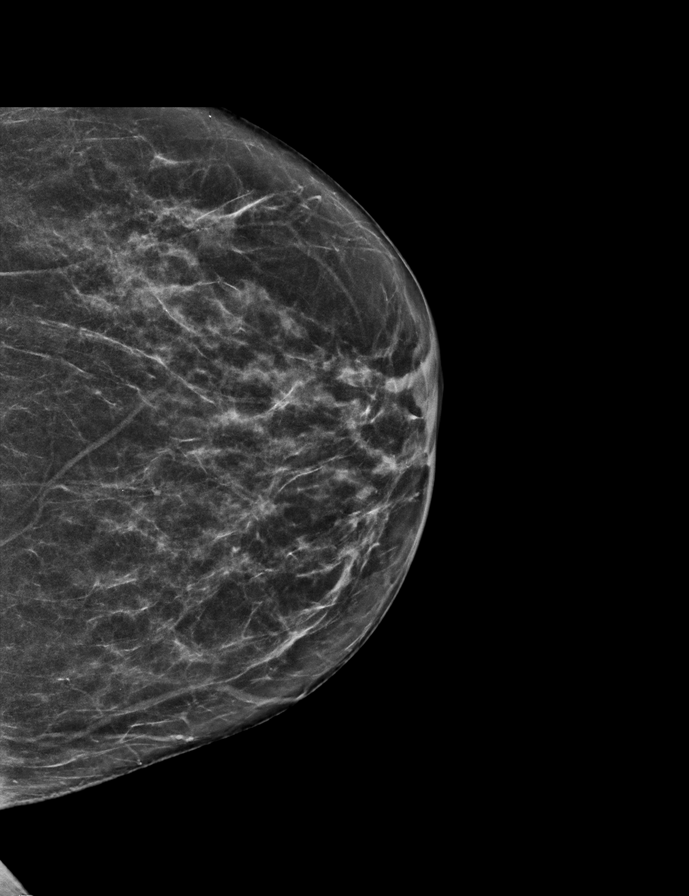

[R MLO synth-2D]
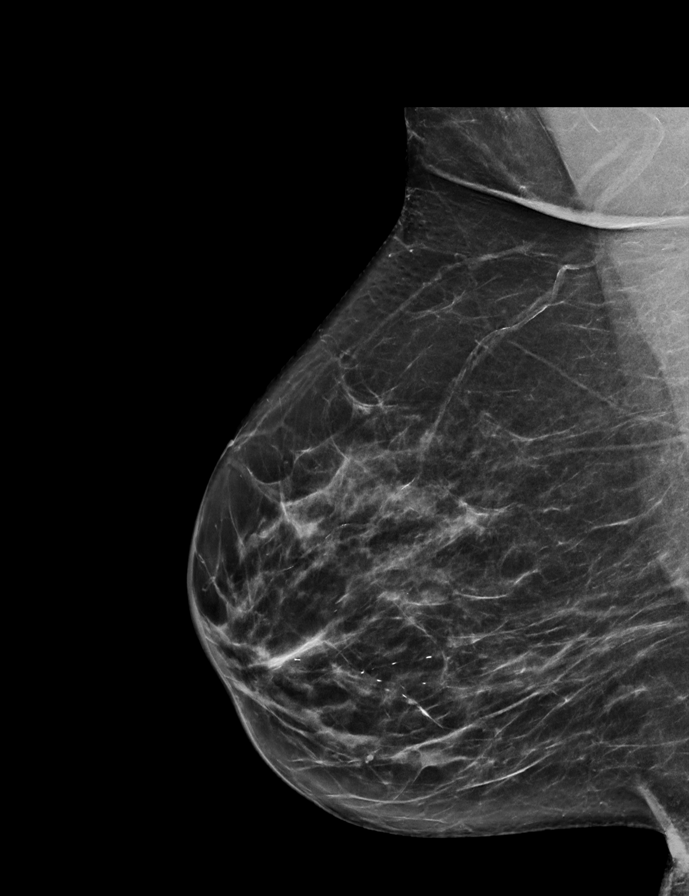

[L MLO synth-2D]
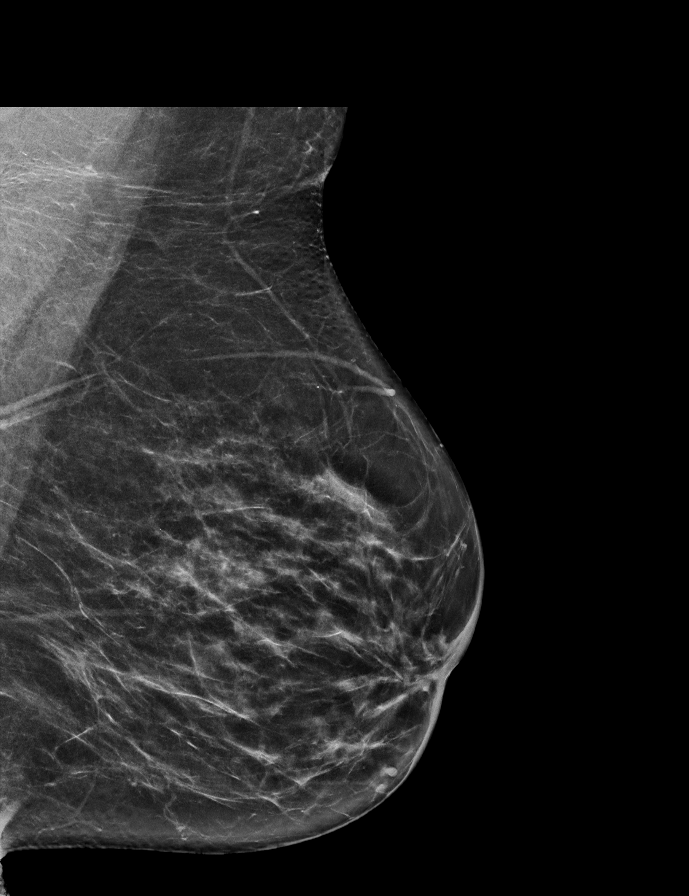

[R CC synth-2D]
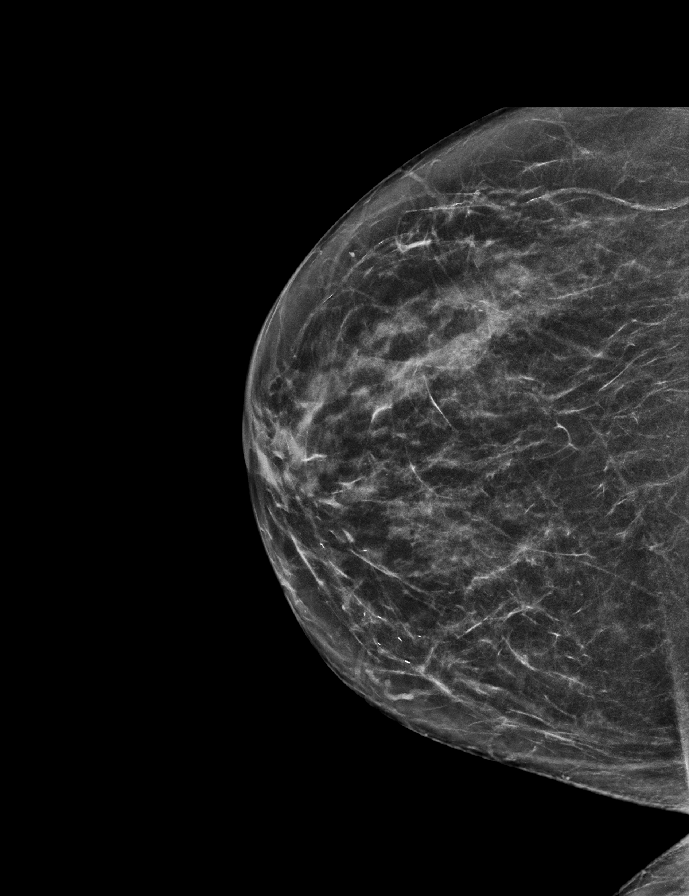

[L CC tomo · 2 of 58 frames shown]
[frame 19/58]
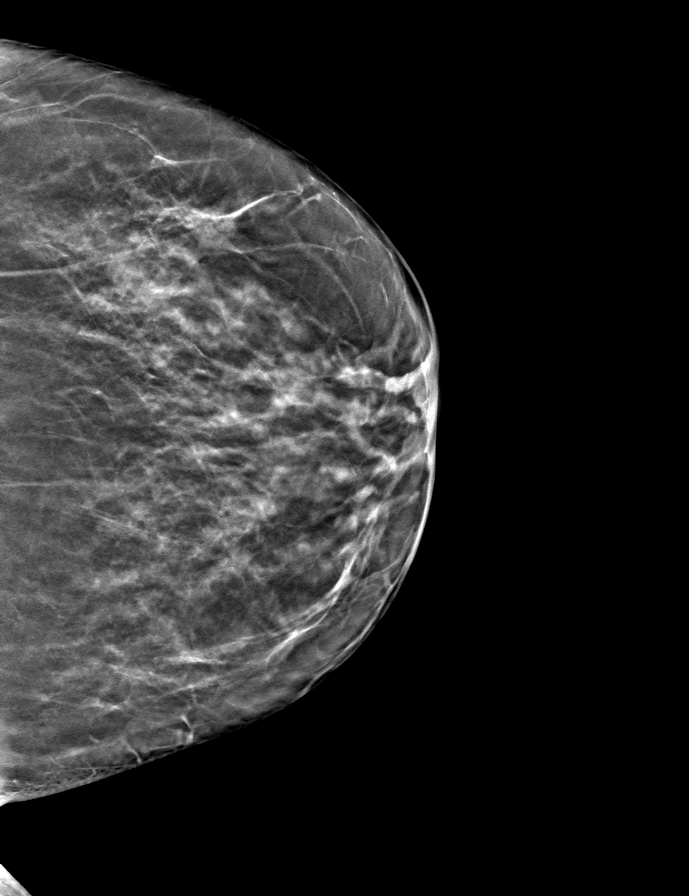
[frame 29/58]
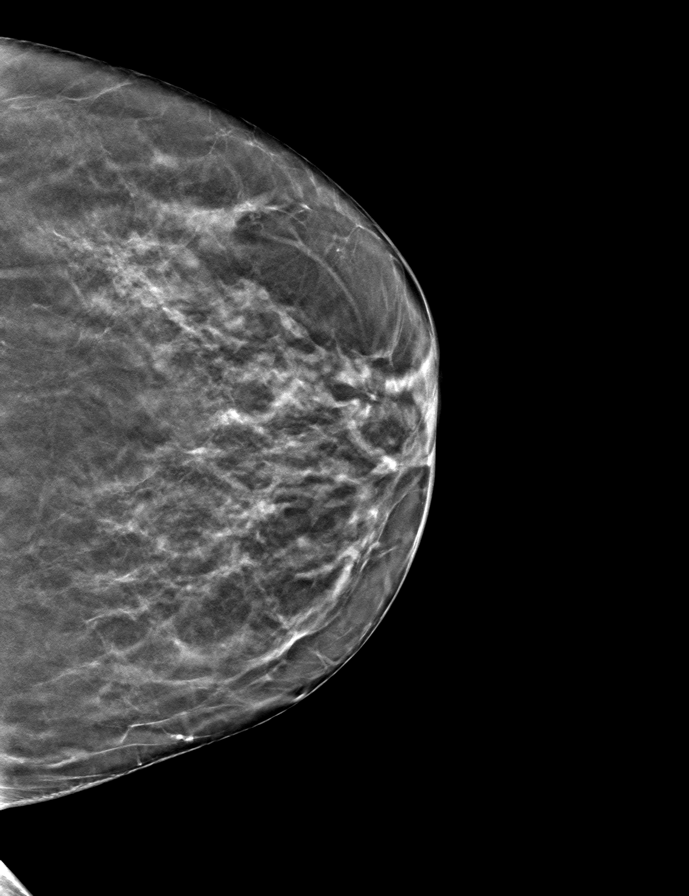

[R MLO tomo · tomo slice 35/69.0]
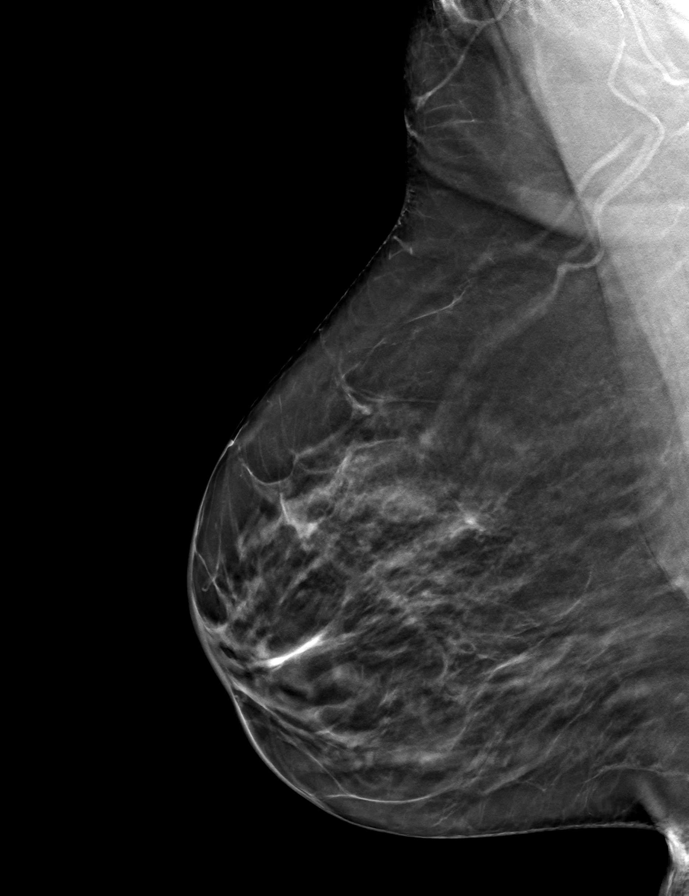

[R CC tomo · tomo slice 31/60.0]
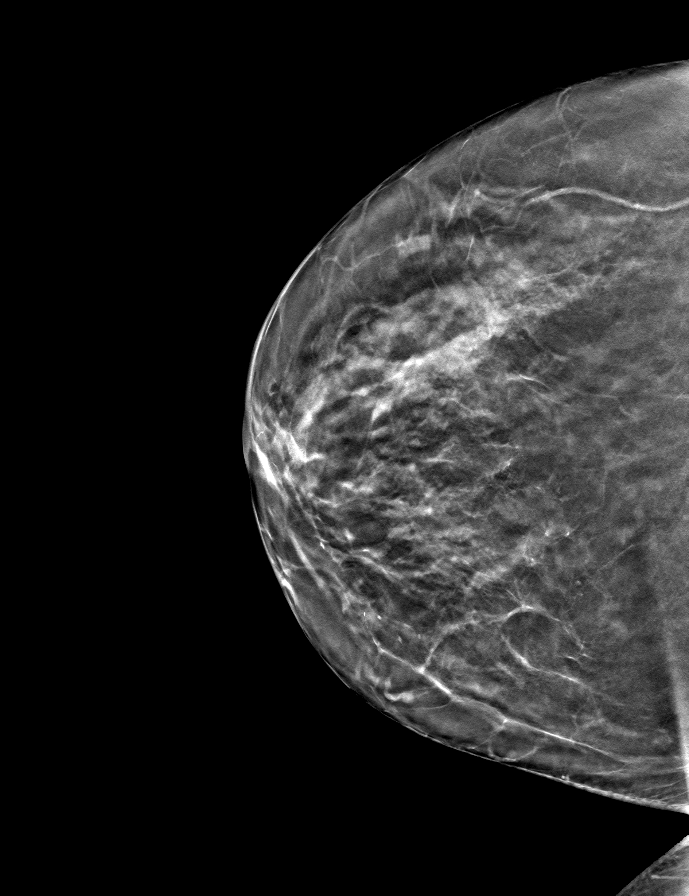

[L MLO tomo · tomo slice 35/68.0]
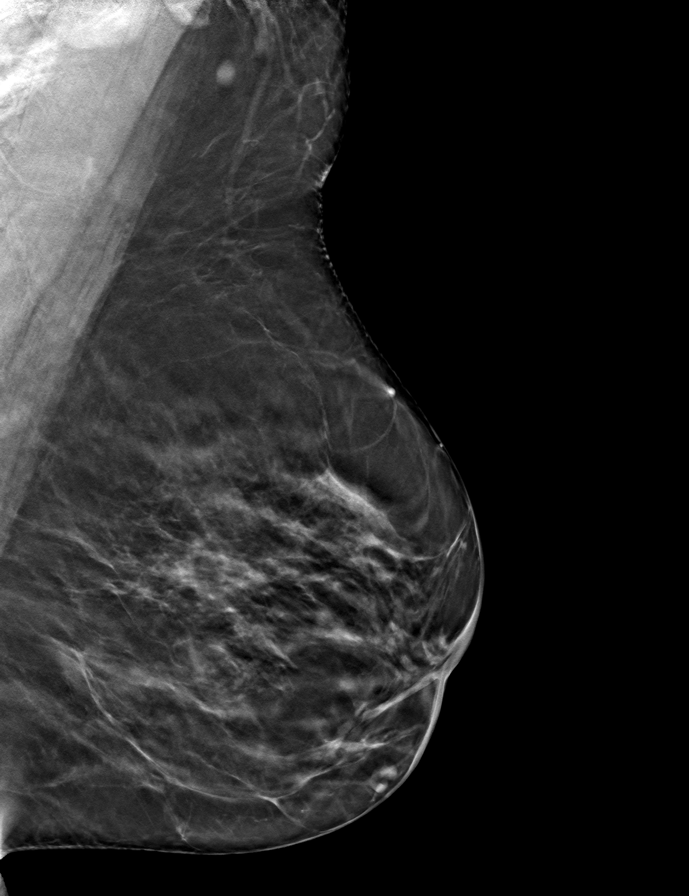

[9 of 24 positions shown; findings below may reference images not displayed]

ACR Breast Density Category c: The breast tissue is heterogeneously
dense, which may obscure small masses.
FINDINGS: There are no findings suspicious for malignancy.
IMPRESSION: No mammographic evidence of malignancy. A result letter of this
screening mammogram will be mailed directly to the patient.

RECOMMENDATION:
Screening mammogram in one year. (Code:Q3-W-BC3)

BI-RADS CATEGORY  1: Negative.

## 2021-12-17 ENCOUNTER — Ambulatory Visit: Payer: Medicare Other | Attending: Audiologist | Admitting: Audiologist

## 2021-12-17 DIAGNOSIS — H9313 Tinnitus, bilateral: Secondary | ICD-10-CM | POA: Diagnosis present

## 2021-12-17 DIAGNOSIS — H900 Conductive hearing loss, bilateral: Secondary | ICD-10-CM | POA: Insufficient documentation

## 2021-12-17 NOTE — Procedures (Signed)
  [facility] and [place], [place]  [address] [phone]  AUDIOLOGICAL  EVALUATION  NAME: Nancy Burton     DOB:   [birth year]      MRN: [id number]                                                                                     DATE: 12/17/2021     REFERENT: Nancy Flavors, MD STATUS: Outpatient DIAGNOSIS: Tinnitus, Conductive Hearing Loss  History: Nancy Burton was seen for an audiological evaluation.  Nancy Burton is receiving a hearing evaluation due to concerns for tinnitus that has gone on for many years. Nancy Burton has no difficulty hearing. The tinnitus had no particular start time, it has just been going on for many years. Nancy Burton had difficulty describing the tinnitus. She at different times said it sounds like a ring, or like the ocean, or like her ears are clogged. No pain or pressure reported in either ear. Tinnitus present in both ears. Nancy Burton has no history of vertigo or dizziness.  Medical history negative for any condition which is a risk factor for hearing loss. No other relevant case history reported.    Evaluation:  Otoscopy showed a clear view of the tympanic membranes, bilaterally Tympanometry results were consistent with normal middle ear function, bilaterally   Audiometric testing was completed using conventional audiometry with insert and supraural transducer. Speech Recognition Thresholds were  consistent with pure tone averages, 20dB in each ear. Word Recognition was excellent at conversation level of 60dB. Pure tone thresholds show normal sloping to mild hearing loss after 4kHz in both ears. Test results are consistent with significant air bone gaps, with bone thresholds at -10dB unmasked from 1-4kHz. Masked bone thresholds in right ear stayed at -10dB. Masked left ear thresholds only show elevated threshold at [facility]. See audiogram under media.   Results:  The test results were reviewed with Nancy Burton and she was given copies  of her audiogram. [place] has essentially normal hearing.  She does have better than expected bone conduction thresholds. These elevated thresholds and her tinnitus warrant an evaluation with Otolaryngology to rule out medical condition such as third window lesion. Recommend repeat hearing test at this time.   Recommendations: Referral to ENT Physician necessary due to conductive component to hearing with elevated conduction scores.    32 minutes spent testing and counseling on results.   Nancy Burton  Audiologist, Au.D., CCC-A 12/17/2021  3:38 PM  Cc: Nancy Flavors, MD

## 2022-02-19 NOTE — Progress Notes (Signed)
CARDIOLOGY CONSULT NOTE       Patient ID: Nancy Burton MRN: [id number] DOB/AGE: [birth year] 66 y.o.  Admit date: (Not on file) Referring Physician: [place] Primary Physician: Zhou-Talbert, Elwyn Lade, MD Primary Cardiologist: New Reason for Consultation: Palpitations/Tachycardia  Active Problems:   * No active hospital problems. *   HPI:  66 y.o. referred by [facility] DO for tachycardia. Patient notes HR in 150-160 range with exertion on smart watch.  She has HTN. Recently friend passed of heart issues and she has anxiety about health of her heart BP currently being Rx with ACE HR declines with rest to 80;s No syncope, chest pain or dyspnea Non smoker Occasional ETOH Had a normal stress echo 11/25/12 No recent labs to review   Has a daughter in [place] and son in [place] to play pickle ball No chest pain palpitations or syncope   ROS All other systems reviewed and negative except as noted above  Past Medical History:  Diagnosis Date   Adenocarcinoma in situ (AIS) of uterine cervix 1983   Cone biopsy the cervix subsequent, TVH negative   Arthritis    Benign neoplasm of cervix uteri    Bilateral tinnitus    BMI 24.0-24.9, adult    Chicken pox    COVID    History of hysterectomy    Hypertension    Osteoarthritis    Osteopenia 07/2017   T score -1.7 FRAX 9% / 1%   PONV (postoperative nausea and vomiting)    Tachycardia    Vitamin D deficiency     Family History  Problem Relation Age of Onset   Hypertension Mother    Uterine cancer Mother    Hyperlipidemia Father    Cancer - Prostate Father    Diabetes Other    Breast cancer Neg Hx     Social History   Socioeconomic History   Marital status: Married    Spouse name: Not on file   Number of children: Not on file   Years of education: Not on file   Highest education level: Not on file  Occupational History   Not on file  Tobacco Use   Smoking status: Former    Smokeless tobacco: Never  Vaping Use   Vaping Use: Never used  Substance and Sexual Activity   Alcohol use: Yes    Alcohol/week: 0.0 standard drinks of alcohol    Comment: Occas wine   Drug use: No   Sexual activity: Yes    Birth control/protection: Surgical    Comment: HYST-1st intercourse 66 yo-Fewer than 5 partners  Other Topics Concern   Not on file  Social History Narrative   Not on file   Social Determinants of Health   Financial Resource Strain: Not on file  Food Insecurity: Not on file  Transportation Needs: Not on file  Physical Activity: Not on file  Stress: Not on file  Social Connections: Not on file  Intimate Partner Violence: Not on file    Past Surgical History:  Procedure Laterality Date   CERVICAL CONE BIOPSY  1983   COLONOSCOPY     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 With goji berry and Co Q 10, Disp: , Rfl:    Multiple Vitamin (MULTIVITAMIN) tablet, Take 1 tablet by mouth daily., Disp: , Rfl:    Omega-3 Fatty Acids (FISH OIL PO), Take by mouth., Disp: , Rfl:    acetaminophen (TYLENOL) 500 MG tablet, Take 1,000 mg by mouth every 6 (six) hours as needed. (Patient not taking: Reported on 02/20/2022), Disp: , Rfl:    Calcium Carb-Cholecalciferol (CALCIUM 1000 + D PO), Take by mouth. (Patient not taking: Reported on 02/20/2022), Disp: , Rfl:    Cholecalciferol (VITAMIN D PO), Take by mouth. (Patient not taking: Reported on 02/20/2022), Disp: , Rfl:    Coenzyme Q10 (CO Q-10) 30 MG CAPS, Take by mouth. (Patient not taking: Reported on 02/20/2022), Disp: , Rfl:     lisinopril (PRINIVIL,ZESTRIL) 20 MG tablet, Take 20 mg by mouth daily. (Patient not taking: Reported on 02/20/2022), Disp: , Rfl:    lisinopril (ZESTRIL) 10 MG tablet, Take 10 mg by mouth daily. (Patient not taking: Reported on 02/20/2022), Disp: , Rfl:     Physical Exam: Blood pressure 130/82, pulse 95, height '5\' 4"'$  (1.626 m), weight 143 lb (64.9 kg), SpO2 98 %.    Affect appropriate Healthy:  appears stated age 66: normal Neck supple with no adenopathy JVP normal no bruits no thyromegaly Lungs clear with no wheezing and good diaphragmatic motion Heart:  S1/S2 no murmur, no rub, gallop or click PMI normal Abdomen: benighn, BS positve, no tenderness, no AAA no bruit.  No HSM or HJR post hysterectomy  Distal pulses intact with no bruits No edema Neuro non-focal Skin warm and dry Post left THR    Labs:   Lab Results  Component Value Date   WBC 4.7 09/22/2018   HGB 13.7 09/22/2018   HCT 39.7 09/22/2018   MCV 90.4 09/22/2018   PLT 276 09/22/2018   No results for input(s): "NA", "K", "CL", "CO2", "BUN", "CREATININE", "CALCIUM", "PROT", "BILITOT", "ALKPHOS", "ALT", "AST", "GLUCOSE" in the last 168 hours.  Invalid input(s): "LABALBU" No results found for: "CKTOTAL", "CKMB", "CKMBINDEX", "TROPONINI"  Lab Results  Component Value Date   CHOL 194 09/22/2018   CHOL 199 07/15/2017   CHOL 204 (H) 06/03/2016   Lab Results  Component Value Date   HDL 59 09/22/2018   HDL 69 07/15/2017   HDL 75 06/03/2016   Lab Results  Component Value Date   LDLCALC 117 (H) 09/22/2018   LDLCALC 111 (H) 07/15/2017   LDLCALC 111 (H) 06/03/2016   Lab Results  Component Value Date   TRIG 84 09/22/2018   TRIG 88 07/15/2017   TRIG 92 06/03/2016   Lab Results  Component Value Date   CHOLHDL 3.3 09/22/2018   CHOLHDL 2.9 07/15/2017   CHOLHDL 2.7 06/03/2016   No results found for: "LDLDIRECT"    Radiology: No results found.  EKG: SR rate 95 normal    ASSESSMENT AND PLAN:    Tachycardia:  Normal exam and ECG. Related to exertion and ? Recent friends cardiac illness will get copy of her labs from primary in [place] earlier this year make sure Hct/TSH done Echo to r/o structural heart dx.  HTN:  continue ACE   CAD Risk: recommended calcium score to risk stratify   Calcium score TTE    F/U cardiology PRN   Signed: Jenkins Rouge 02/20/2022, 1:46 PM

## 2022-02-20 ENCOUNTER — Encounter: Payer: Self-pay | Admitting: Cardiovascular Disease

## 2022-02-20 ENCOUNTER — Ambulatory Visit (INDEPENDENT_AMBULATORY_CARE_PROVIDER_SITE_OTHER): Payer: Medicare Other | Admitting: Cardiovascular Disease

## 2022-02-20 VITALS — BP 130/82 | HR 95 | Ht 64.0 in | Wt 143.0 lb

## 2022-02-20 DIAGNOSIS — R000 Tachycardia, unspecified: Secondary | ICD-10-CM | POA: Diagnosis not present

## 2022-02-20 DIAGNOSIS — R9431 Abnormal electrocardiogram [ECG] [EKG]: Secondary | ICD-10-CM | POA: Diagnosis not present

## 2022-02-20 DIAGNOSIS — I10 Essential (primary) hypertension: Secondary | ICD-10-CM

## 2022-02-20 DIAGNOSIS — R002 Palpitations: Secondary | ICD-10-CM | POA: Diagnosis not present

## 2022-02-20 NOTE — Patient Instructions (Addendum)
Medication Instructions:  Your physician recommends that you continue on your current medications as directed. Please refer to the Current Medication list given to you today.  *If you need a refill on your cardiac medications before your next appointment, please call your pharmacy*  Lab Work: If you have labs (blood work) drawn today and your tests are completely normal, you will receive your results only by: [place] (if you have MyChart) OR A paper copy in the mail If you have any lab test that is abnormal or we need to change your treatment, we will call you to review the results.  Testing/Procedures: Your physician has requested that you have an echocardiogram. Echocardiography is a painless test that uses sound waves to create images of your heart. It provides your doctor with information about the size and shape of your heart and how well your heart's chambers and valves are working. This procedure takes approximately one hour. There are no restrictions for this procedure.  Cardiac CT scanning for calcium score, (CAT scanning), is a noninvasive, special x-ray that produces cross-sectional images of the body using x-rays and a computer. CT scans help physicians diagnose and treat medical conditions. For some CT exams, a contrast material is used to enhance visibility in the area of the body being studied. CT scans provide greater clarity and reveal more details than regular x-ray exams.  Follow-Up: At [facility], you and your health needs are our priority.  As part of our continuing mission to provide you with exceptional heart care, we have created designated Provider Care Teams.  These Care Teams include your primary Cardiologist (physician) and Advanced Practice Providers (APPs -  Physician Assistants and Nurse Practitioners) who all work together to provide you with the care you need, when you need it.  We recommend signing up for the patient portal called "MyChart".  Sign up  information is provided on this After Visit Summary.  MyChart is used to connect with patients for Virtual Visits (Telemedicine).  Patients are able to view lab/test results, encounter notes, upcoming appointments, etc.  Non-urgent messages can be sent to your provider as well.   To learn more about what you can do with MyChart, go to [email or web address].    Your next appointment:   As needed  The format for your next appointment:   In Person  Provider:   Jenkins Rouge, MD {     Important Information About Sugar

## 2022-02-22 ENCOUNTER — Encounter: Payer: Self-pay | Admitting: Family Medicine

## 2022-02-22 DIAGNOSIS — I10 Essential (primary) hypertension: Secondary | ICD-10-CM

## 2022-02-26 NOTE — Telephone Encounter (Signed)
-----   Message from Josue Hector, MD sent at 02/22/2022  2:13 PM EDT ----- Labs only including bmet and chol Needs Hct/Hb and TSH/T4  Will see what calcium score shows regarding LDL 146 and need for Rx ----- Message ----- From: Hipolito Bayley Sent: 02/22/2022   1:57 PM EDT To: Josue Hector, MD

## 2022-02-27 ENCOUNTER — Ambulatory Visit (HOSPITAL_COMMUNITY)
Admission: RE | Admit: 2022-02-27 | Discharge: 2022-02-27 | Disposition: A | Discharge status: Home / Self Care | Payer: Medicare Other | Source: Ambulatory Visit | Attending: Cardiovascular Disease | Admitting: Cardiovascular Disease

## 2022-02-27 DIAGNOSIS — R9431 Abnormal electrocardiogram [ECG] [EKG]: Secondary | ICD-10-CM | POA: Insufficient documentation

## 2022-02-27 DIAGNOSIS — R002 Palpitations: Secondary | ICD-10-CM | POA: Insufficient documentation

## 2022-02-27 DIAGNOSIS — I10 Essential (primary) hypertension: Secondary | ICD-10-CM | POA: Insufficient documentation

## 2022-02-27 DIAGNOSIS — R000 Tachycardia, unspecified: Secondary | ICD-10-CM | POA: Insufficient documentation

## 2022-03-04 ENCOUNTER — Other Ambulatory Visit (HOSPITAL_COMMUNITY)
Admission: RE | Admit: 2022-03-04 | Discharge: 2022-03-04 | Disposition: A | Discharge status: Home / Self Care | Payer: Medicare Other | Source: Ambulatory Visit | Attending: Cardiovascular Disease | Admitting: Cardiovascular Disease

## 2022-03-04 ENCOUNTER — Telehealth: Payer: Self-pay

## 2022-03-04 ENCOUNTER — Ambulatory Visit (HOSPITAL_COMMUNITY): Payer: Medicare Other

## 2022-03-04 ENCOUNTER — Encounter (HOSPITAL_COMMUNITY): Payer: Self-pay | Admitting: Cardiovascular Disease

## 2022-03-04 ENCOUNTER — Encounter: Payer: Self-pay | Admitting: Cardiology

## 2022-03-04 DIAGNOSIS — R9431 Abnormal electrocardiogram [ECG] [EKG]: Secondary | ICD-10-CM

## 2022-03-04 DIAGNOSIS — I10 Essential (primary) hypertension: Secondary | ICD-10-CM

## 2022-03-04 DIAGNOSIS — R000 Tachycardia, unspecified: Secondary | ICD-10-CM

## 2022-03-04 DIAGNOSIS — R002 Palpitations: Secondary | ICD-10-CM

## 2022-03-04 LAB — CBC
HCT: 41.9 % (ref 36.0–46.0)
Hemoglobin: 14 g/dL (ref 12.0–15.0)
MCH: 31.7 pg (ref 26.0–34.0)
MCHC: 33.4 g/dL (ref 30.0–36.0)
MCV: 94.8 fL (ref 80.0–100.0)
Platelets: 295 10*3/uL (ref 150–400)
RBC: 4.42 MIL/uL (ref 3.87–5.11)
RDW: 12.3 % (ref 11.5–15.5)
WBC: 6.1 10*3/uL (ref 4.0–10.5)
nRBC: 0 % (ref 0.0–0.2)

## 2022-03-04 LAB — TSH: TSH: 1.953 u[IU]/mL (ref 0.350–4.500)

## 2022-03-04 MED ORDER — ATORVASTATIN CALCIUM 10 MG PO TABS: 10.0000 mg | ORAL_TABLET | Freq: Every day | ORAL | 3 refills | Status: DC

## 2022-03-04 NOTE — Progress Notes (Unsigned)
Patient ID: Nancy Burton, female   DOB: [birth year], 66 y.o.   MRN: [id number]  Verified appointment "no show" status with [place] at 3:18pm.

## 2022-03-04 NOTE — Telephone Encounter (Signed)
-----   Message from Josue Hector, MD sent at 03/04/2022  8:33 AM EDT ----- LDL 146 calcium score above average for age would start lipitor 10 mg daily and repeat labs in 3 months

## 2022-03-04 NOTE — Telephone Encounter (Signed)
Called patient with results. Patient will have lab work done in 3 months at [facility] near [employer]. While on the phone patient realized she missed her echo appointment today. Patient thought it was on Wednesday. Will see if echo can be scheduled at [facility]. Will send message to [facility] to help with scheduling.

## 2022-03-05 LAB — T4: T4, Total: 5.7 ug/dL (ref 4.5–12.0)

## 2022-03-19 ENCOUNTER — Ambulatory Visit (HOSPITAL_COMMUNITY)
Admission: RE | Admit: 2022-03-19 | Discharge: 2022-03-19 | Disposition: A | Discharge status: Home / Self Care | Payer: Medicare Other | Source: Ambulatory Visit | Attending: Cardiovascular Disease | Admitting: Cardiovascular Disease

## 2022-03-19 DIAGNOSIS — I10 Essential (primary) hypertension: Secondary | ICD-10-CM | POA: Diagnosis present

## 2022-03-19 DIAGNOSIS — R000 Tachycardia, unspecified: Secondary | ICD-10-CM | POA: Insufficient documentation

## 2022-03-19 DIAGNOSIS — R9431 Abnormal electrocardiogram [ECG] [EKG]: Secondary | ICD-10-CM

## 2022-03-19 DIAGNOSIS — R002 Palpitations: Secondary | ICD-10-CM | POA: Insufficient documentation

## 2022-03-19 LAB — ECHOCARDIOGRAM COMPLETE
Area-P 1/2: 5.02 cm2
S' Lateral: 2.6 cm

## 2022-03-19 NOTE — Progress Notes (Signed)
*  PRELIMINARY RESULTS* Echocardiogram 2D Echocardiogram has been performed.  Nancy Burton 03/19/2022, 11:09 AM

## 2023-02-17 ENCOUNTER — Other Ambulatory Visit: Payer: Self-pay | Admitting: Family Medicine

## 2023-02-17 DIAGNOSIS — Z1231 Encounter for screening mammogram for malignant neoplasm of breast: Secondary | ICD-10-CM

## 2023-02-27 ENCOUNTER — Other Ambulatory Visit: Payer: Self-pay | Admitting: Cardiovascular Disease

## 2023-04-09 ENCOUNTER — Ambulatory Visit
Admission: RE | Admit: 2023-04-09 | Discharge: 2023-04-09 | Disposition: A | Discharge status: Home / Self Care | Payer: Medicare Other | Source: Ambulatory Visit | Attending: Family Medicine | Admitting: Family Medicine

## 2023-04-09 DIAGNOSIS — Z1231 Encounter for screening mammogram for malignant neoplasm of breast: Secondary | ICD-10-CM

## 2023-04-10 ENCOUNTER — Ambulatory Visit (INDEPENDENT_AMBULATORY_CARE_PROVIDER_SITE_OTHER): Payer: PRIVATE HEALTH INSURANCE | Admitting: Otolaryngology

## 2024-05-05 ENCOUNTER — Other Ambulatory Visit: Payer: Self-pay | Admitting: Family Medicine

## 2024-05-05 DIAGNOSIS — Z1231 Encounter for screening mammogram for malignant neoplasm of breast: Secondary | ICD-10-CM

## 2024-05-18 ENCOUNTER — Ambulatory Visit
Admission: RE | Admit: 2024-05-18 | Discharge: 2024-05-18 | Disposition: A | Discharge status: Home / Self Care | Payer: PRIVATE HEALTH INSURANCE | Source: Ambulatory Visit | Attending: Family Medicine | Admitting: Family Medicine

## 2024-05-18 DIAGNOSIS — Z1231 Encounter for screening mammogram for malignant neoplasm of breast: Secondary | ICD-10-CM
